# Patient Record
Sex: Female | Born: 1945 | Race: White | Hispanic: No | Marital: Married | Smoking: Never smoker
Health system: Southern US, Community
[De-identification: ages and names within clinical notes are randomized; demographics above are authoritative.]

## PROBLEM LIST (undated history)

## (undated) HISTORY — PX: PARATHYROIDECTOMY / EXPLORATION OF PARATHYROIDS: SUR1002

---

## 1998-05-16 ENCOUNTER — Ambulatory Visit (HOSPITAL_COMMUNITY): Admission: RE | Admit: 1998-05-16 | Discharge: 1998-05-16 | Payer: Self-pay | Admitting: Geriatric Medicine

## 1999-01-23 ENCOUNTER — Encounter: Admission: RE | Admit: 1999-01-23 | Discharge: 1999-01-23 | Payer: Self-pay | Admitting: *Deleted

## 1999-04-19 ENCOUNTER — Other Ambulatory Visit: Admission: RE | Admit: 1999-04-19 | Discharge: 1999-04-19 | Payer: Self-pay | Admitting: *Deleted

## 2000-01-28 ENCOUNTER — Encounter: Admission: RE | Admit: 2000-01-28 | Discharge: 2000-01-28 | Payer: Self-pay | Admitting: *Deleted

## 2000-04-21 ENCOUNTER — Other Ambulatory Visit: Admission: RE | Admit: 2000-04-21 | Discharge: 2000-04-21 | Payer: Self-pay | Admitting: *Deleted

## 2001-02-02 ENCOUNTER — Encounter: Admission: RE | Admit: 2001-02-02 | Discharge: 2001-02-02 | Payer: Self-pay | Admitting: *Deleted

## 2001-05-25 ENCOUNTER — Other Ambulatory Visit: Admission: RE | Admit: 2001-05-25 | Discharge: 2001-05-25 | Payer: Self-pay | Admitting: *Deleted

## 2002-02-03 ENCOUNTER — Encounter: Admission: RE | Admit: 2002-02-03 | Discharge: 2002-02-03 | Payer: Self-pay | Admitting: *Deleted

## 2002-05-10 ENCOUNTER — Other Ambulatory Visit: Admission: RE | Admit: 2002-05-10 | Discharge: 2002-05-10 | Payer: Self-pay | Admitting: *Deleted

## 2003-02-07 ENCOUNTER — Encounter: Admission: RE | Admit: 2003-02-07 | Discharge: 2003-02-07 | Payer: Self-pay | Admitting: *Deleted

## 2003-05-26 ENCOUNTER — Other Ambulatory Visit: Admission: RE | Admit: 2003-05-26 | Discharge: 2003-05-26 | Payer: Self-pay | Admitting: *Deleted

## 2003-08-16 ENCOUNTER — Ambulatory Visit (HOSPITAL_COMMUNITY): Admission: RE | Admit: 2003-08-16 | Discharge: 2003-08-16 | Payer: Self-pay | Admitting: Surgery

## 2003-08-28 ENCOUNTER — Ambulatory Visit (HOSPITAL_COMMUNITY): Admission: RE | Admit: 2003-08-28 | Discharge: 2003-08-28 | Payer: Self-pay | Admitting: Surgery

## 2003-10-12 ENCOUNTER — Observation Stay (HOSPITAL_COMMUNITY): Admission: RE | Admit: 2003-10-12 | Discharge: 2003-10-13 | Payer: Self-pay | Admitting: Surgery

## 2003-10-12 ENCOUNTER — Encounter (INDEPENDENT_AMBULATORY_CARE_PROVIDER_SITE_OTHER): Payer: Self-pay | Admitting: *Deleted

## 2004-02-09 ENCOUNTER — Encounter: Admission: RE | Admit: 2004-02-09 | Discharge: 2004-02-09 | Payer: Self-pay | Admitting: *Deleted

## 2005-02-13 ENCOUNTER — Encounter: Admission: RE | Admit: 2005-02-13 | Discharge: 2005-02-13 | Payer: Self-pay | Admitting: *Deleted

## 2005-06-17 ENCOUNTER — Encounter: Admission: RE | Admit: 2005-06-17 | Discharge: 2005-06-17 | Payer: Self-pay | Admitting: *Deleted

## 2006-02-23 ENCOUNTER — Encounter: Admission: RE | Admit: 2006-02-23 | Discharge: 2006-02-23 | Payer: Self-pay | Admitting: *Deleted

## 2007-03-18 ENCOUNTER — Encounter: Admission: RE | Admit: 2007-03-18 | Discharge: 2007-03-18 | Payer: Self-pay | Admitting: Geriatric Medicine

## 2007-03-24 ENCOUNTER — Encounter: Admission: RE | Admit: 2007-03-24 | Discharge: 2007-03-24 | Payer: Self-pay | Admitting: Geriatric Medicine

## 2008-03-21 ENCOUNTER — Encounter: Admission: RE | Admit: 2008-03-21 | Discharge: 2008-03-21 | Payer: Self-pay | Admitting: Geriatric Medicine

## 2008-11-06 ENCOUNTER — Encounter: Admission: RE | Admit: 2008-11-06 | Discharge: 2008-11-06 | Payer: Self-pay | Admitting: Geriatric Medicine

## 2009-03-23 ENCOUNTER — Encounter: Admission: RE | Admit: 2009-03-23 | Discharge: 2009-03-23 | Payer: Self-pay

## 2010-03-27 ENCOUNTER — Encounter
Admission: RE | Admit: 2010-03-27 | Discharge: 2010-03-27 | Payer: Self-pay | Source: Home / Self Care | Attending: Geriatric Medicine | Admitting: Geriatric Medicine

## 2010-08-12 ENCOUNTER — Other Ambulatory Visit: Payer: Self-pay | Admitting: Obstetrics and Gynecology

## 2010-08-23 NOTE — Op Note (Signed)
NAME:  Eileen Robertson, Eileen Robertson                            ACCOUNT NO.:  [id number]   MEDICAL RECORD NO.:  [id number]                   PATIENT TYPE:  AMB   LOCATION:  DAY                                  FACILITY:  [facility]   PHYSICIAN:  Velora Heckler, M.D.                DATE OF BIRTH:  08-03-1945   DATE OF PROCEDURE:  10/12/2003  DATE OF DISCHARGE:                                 OPERATIVE REPORT   PREOPERATIVE DIAGNOSES:  Primary hyperparathyroidism.   POSTOPERATIVE DIAGNOSES:  Primary hyperparathyroidism.   PROCEDURE:  Parathyroid exploration with right superior parathyroidectomy.   SURGEON:  Velora Heckler, M.D.   ASSISTANT:  Abigail Miyamoto, M.D.   ANESTHESIA:  General.   ESTIMATED BLOOD LOSS:  Minimal.   PREPARATION:  Betadine.   COMPLICATIONS:  None.   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  The patient had  bone density scanning which showed a significant bone loss between  sequential studies. Laboratory studies showed an intact PTH level of 133 and  a serum calcium level of 9.9.  Preoperative level was elevated at 10.3.  A  24 hour urine collection showed elevated level of 260.  The patient had been  seen by Dr. Dorisann Frames, M.D., her endocrinologist, who diagnosed primary  hyperparathyroidism.  The patient subsequently underwent Sestamibi scanning  which failed to identify a parathyroid adenoma. 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  The patient now comes to surgery a neck exploration.   DESCRIPTION OF PROCEDURE:  The procedure was done in OR #1 at the [facility].  The patient is brought to the operating room,  placed in a supine position on the operating room table.  Following the  administration of general anesthesia, the patient is prepped and draped in  the usual strict aseptic  fashion.  After ascertaining that an adequate level  of anesthesia had been obtained, an incision is made in the right mid neck  with a #15 blade.  Dissection was carried down through the skin and  subcutaneous tissues.  The platysma is divided with the electrocautery.  Platysmal flaps are elevated cephalad and caudad and a Weitlaner retractor  is placed for exposure. Strap muscles are incised in the midline. The right  thyroid lobe is identified.  Dissection along the right thyroid lobe reveals  a normal appearing inferior parathyroid gland. This is quite small. It is  just below the inferior pole of the thyroid on the right side of the  trachea. It is convincing for a normal parathyroid gland.  Further  dissection behind the right lobe of the thyroid is carried out. Care is  taken to preserve the recurrent nerve and the inferior thyroid artery.  Carotid artery  is dissected out, hypopharynx and esophagus are carefully  dissected out. The right thyroid lobe is thoroughly explored.  The  retroesophageal space is explored. No obvious evidence of parathyroid  adenoma is noted.   MRI scans are brought to the operating room and again reviewed. There is  clear evidence of  an abnormality in the posterior region behind the thyroid  on the right side. Therefore our incision on the neck is extended across the  midline. Skin flaps are developed cephalad and caudad from the thyroid notch  to the sternal notch and a Mahorner self retaining retractor is placed for  exposure. The strap muscles are fully incised in the midline. The right  thyroid lobe is completely mobilized and rotated medially. Further  exploration along the course of the inferior thyroid artery and recurrent  nerve reveals a mass-like lesion deep in the tracheoesophageal groove. This  is gently dissect out taking care to preserve the recurrent nerve. Branches  of the inferior thyroid artery are divided between small Ligalips.  This   does appear to be parathyroid tissue. The nodule is mobilized. Vascular  tributaries are divided between small Ligaclips and the gland is gently  excised. It measures 9 x 8 x 5 mm in size. It is submitted in toto to  pathology for review. Dr. 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  Good hemostasis is  obtained in the right neck. Surgicel was placed along the course of the  recurrent nerve and in the tracheoesophageal groove. The right thyroid lobe  is returned to its normal position.  Good hemostasis is assured. The strap  muscles are reapproximated in the midline with interrupted 3-0 Vicryl  sutures. The platysma is closed with interrupted 3-0 Vicryl sutures. The  skin edges were reapproximated with a running 4-0 Vicryl subcuticular  suture. The wound is washed and dried and Steri-Strips are applied. Sterile  dressings are applied. The patient is awakened from anesthesia and brought  to the recovery room in stable condition. The patient tolerated the  procedure well.                                               Velora Heckler, M.D.    TMG/MEDQ  D:  10/12/2003  T:  10/12/2003  Job:  [id number]   cc:   Hal T. Stoneking, M.D.  [address]  [place], [place] [address]  Fax: [phone]   Dorisann Frames, M.D.  [address] [place] [address]  Fax: [phone]   Pershing Cox, M.D.  [address]  [place]  [place] [address]  Fax: [phone]

## 2011-02-24 ENCOUNTER — Other Ambulatory Visit: Payer: Self-pay | Admitting: Geriatric Medicine

## 2011-02-24 DIAGNOSIS — Z1231 Encounter for screening mammogram for malignant neoplasm of breast: Secondary | ICD-10-CM

## 2011-04-04 ENCOUNTER — Ambulatory Visit
Admission: RE | Admit: 2011-04-04 | Discharge: 2011-04-04 | Disposition: A | Discharge status: Home / Self Care | Payer: BC Managed Care – PPO | Source: Ambulatory Visit | Attending: Geriatric Medicine | Admitting: Geriatric Medicine

## 2011-04-04 DIAGNOSIS — Z1231 Encounter for screening mammogram for malignant neoplasm of breast: Secondary | ICD-10-CM

## 2011-04-11 ENCOUNTER — Other Ambulatory Visit: Payer: Self-pay | Admitting: Geriatric Medicine

## 2011-04-11 DIAGNOSIS — R928 Other abnormal and inconclusive findings on diagnostic imaging of breast: Secondary | ICD-10-CM

## 2011-04-18 ENCOUNTER — Ambulatory Visit
Admission: RE | Admit: 2011-04-18 | Discharge: 2011-04-18 | Disposition: A | Discharge status: Home / Self Care | Payer: BC Managed Care – PPO | Source: Ambulatory Visit | Attending: Geriatric Medicine | Admitting: Geriatric Medicine

## 2011-04-18 DIAGNOSIS — R928 Other abnormal and inconclusive findings on diagnostic imaging of breast: Secondary | ICD-10-CM

## 2011-09-16 ENCOUNTER — Other Ambulatory Visit: Payer: Self-pay | Admitting: Dermatology

## 2011-10-20 ENCOUNTER — Other Ambulatory Visit: Payer: Self-pay | Admitting: Dermatology

## 2011-10-20 DIAGNOSIS — D485 Neoplasm of uncertain behavior of skin: Secondary | ICD-10-CM | POA: Diagnosis not present

## 2011-11-06 DIAGNOSIS — C44621 Squamous cell carcinoma of skin of unspecified upper limb, including shoulder: Secondary | ICD-10-CM | POA: Diagnosis not present

## 2012-01-08 DIAGNOSIS — Z85828 Personal history of other malignant neoplasm of skin: Secondary | ICD-10-CM | POA: Diagnosis not present

## 2012-01-08 DIAGNOSIS — L570 Actinic keratosis: Secondary | ICD-10-CM | POA: Diagnosis not present

## 2012-02-11 ENCOUNTER — Other Ambulatory Visit: Payer: Self-pay | Admitting: Geriatric Medicine

## 2012-02-11 DIAGNOSIS — Z1231 Encounter for screening mammogram for malignant neoplasm of breast: Secondary | ICD-10-CM

## 2012-02-24 DIAGNOSIS — Z23 Encounter for immunization: Secondary | ICD-10-CM | POA: Diagnosis not present

## 2012-04-05 ENCOUNTER — Ambulatory Visit
Admission: RE | Admit: 2012-04-05 | Discharge: 2012-04-05 | Disposition: A | Discharge status: Home / Self Care | Payer: Medicare Other | Source: Ambulatory Visit | Attending: Geriatric Medicine | Admitting: Geriatric Medicine

## 2012-04-05 DIAGNOSIS — Z1231 Encounter for screening mammogram for malignant neoplasm of breast: Secondary | ICD-10-CM | POA: Diagnosis not present

## 2012-05-28 DIAGNOSIS — D1801 Hemangioma of skin and subcutaneous tissue: Secondary | ICD-10-CM | POA: Diagnosis not present

## 2012-05-28 DIAGNOSIS — L570 Actinic keratosis: Secondary | ICD-10-CM | POA: Diagnosis not present

## 2012-05-28 DIAGNOSIS — D239 Other benign neoplasm of skin, unspecified: Secondary | ICD-10-CM | POA: Diagnosis not present

## 2012-06-28 DIAGNOSIS — Z79899 Other long term (current) drug therapy: Secondary | ICD-10-CM | POA: Diagnosis not present

## 2012-06-28 DIAGNOSIS — Z23 Encounter for immunization: Secondary | ICD-10-CM | POA: Diagnosis not present

## 2012-06-28 DIAGNOSIS — M899 Disorder of bone, unspecified: Secondary | ICD-10-CM | POA: Diagnosis not present

## 2012-06-28 DIAGNOSIS — Z0000 Encounter for general adult medical examination without abnormal findings: Secondary | ICD-10-CM | POA: Diagnosis not present

## 2012-06-28 DIAGNOSIS — F329 Major depressive disorder, single episode, unspecified: Secondary | ICD-10-CM | POA: Diagnosis not present

## 2012-08-10 DIAGNOSIS — M899 Disorder of bone, unspecified: Secondary | ICD-10-CM | POA: Diagnosis not present

## 2012-08-10 DIAGNOSIS — M949 Disorder of cartilage, unspecified: Secondary | ICD-10-CM | POA: Diagnosis not present

## 2012-09-09 DIAGNOSIS — H52229 Regular astigmatism, unspecified eye: Secondary | ICD-10-CM | POA: Diagnosis not present

## 2012-09-09 DIAGNOSIS — H524 Presbyopia: Secondary | ICD-10-CM | POA: Diagnosis not present

## 2013-03-10 ENCOUNTER — Other Ambulatory Visit: Payer: Self-pay

## 2013-03-10 DIAGNOSIS — Z1231 Encounter for screening mammogram for malignant neoplasm of breast: Secondary | ICD-10-CM

## 2013-04-15 ENCOUNTER — Ambulatory Visit
Admission: RE | Admit: 2013-04-15 | Discharge: 2013-04-15 | Disposition: A | Discharge status: Home / Self Care | Payer: Medicare Other | Source: Ambulatory Visit

## 2013-04-15 DIAGNOSIS — Z1231 Encounter for screening mammogram for malignant neoplasm of breast: Secondary | ICD-10-CM

## 2013-04-18 DIAGNOSIS — Z124 Encounter for screening for malignant neoplasm of cervix: Secondary | ICD-10-CM | POA: Diagnosis not present

## 2013-04-18 DIAGNOSIS — Z01419 Encounter for gynecological examination (general) (routine) without abnormal findings: Secondary | ICD-10-CM | POA: Diagnosis not present

## 2013-06-09 DIAGNOSIS — D239 Other benign neoplasm of skin, unspecified: Secondary | ICD-10-CM | POA: Diagnosis not present

## 2013-06-09 DIAGNOSIS — Z85828 Personal history of other malignant neoplasm of skin: Secondary | ICD-10-CM | POA: Diagnosis not present

## 2013-06-09 DIAGNOSIS — D1801 Hemangioma of skin and subcutaneous tissue: Secondary | ICD-10-CM | POA: Diagnosis not present

## 2013-06-09 DIAGNOSIS — L819 Disorder of pigmentation, unspecified: Secondary | ICD-10-CM | POA: Diagnosis not present

## 2013-06-09 DIAGNOSIS — L570 Actinic keratosis: Secondary | ICD-10-CM | POA: Diagnosis not present

## 2013-06-09 DIAGNOSIS — L821 Other seborrheic keratosis: Secondary | ICD-10-CM | POA: Diagnosis not present

## 2013-06-30 DIAGNOSIS — Z79899 Other long term (current) drug therapy: Secondary | ICD-10-CM | POA: Diagnosis not present

## 2013-06-30 DIAGNOSIS — Z0000 Encounter for general adult medical examination without abnormal findings: Secondary | ICD-10-CM | POA: Diagnosis not present

## 2013-06-30 DIAGNOSIS — Z1331 Encounter for screening for depression: Secondary | ICD-10-CM | POA: Diagnosis not present

## 2013-06-30 DIAGNOSIS — Z23 Encounter for immunization: Secondary | ICD-10-CM | POA: Diagnosis not present

## 2013-07-01 DIAGNOSIS — Z79899 Other long term (current) drug therapy: Secondary | ICD-10-CM | POA: Diagnosis not present

## 2013-07-01 DIAGNOSIS — Z0000 Encounter for general adult medical examination without abnormal findings: Secondary | ICD-10-CM | POA: Diagnosis not present

## 2013-07-01 DIAGNOSIS — Z136 Encounter for screening for cardiovascular disorders: Secondary | ICD-10-CM | POA: Diagnosis not present

## 2013-07-01 DIAGNOSIS — F329 Major depressive disorder, single episode, unspecified: Secondary | ICD-10-CM | POA: Diagnosis not present

## 2013-07-01 DIAGNOSIS — F3289 Other specified depressive episodes: Secondary | ICD-10-CM | POA: Diagnosis not present

## 2013-10-25 DIAGNOSIS — H2510 Age-related nuclear cataract, unspecified eye: Secondary | ICD-10-CM | POA: Diagnosis not present

## 2013-12-05 DIAGNOSIS — H00019 Hordeolum externum unspecified eye, unspecified eyelid: Secondary | ICD-10-CM | POA: Diagnosis not present

## 2014-01-17 DIAGNOSIS — Z23 Encounter for immunization: Secondary | ICD-10-CM | POA: Diagnosis not present

## 2014-03-23 ENCOUNTER — Other Ambulatory Visit: Payer: Self-pay

## 2014-03-23 DIAGNOSIS — Z1231 Encounter for screening mammogram for malignant neoplasm of breast: Secondary | ICD-10-CM

## 2014-04-19 ENCOUNTER — Ambulatory Visit
Admission: RE | Admit: 2014-04-19 | Discharge: 2014-04-19 | Disposition: A | Discharge status: Home / Self Care | Payer: Medicare Other | Source: Ambulatory Visit

## 2014-04-19 DIAGNOSIS — Z1231 Encounter for screening mammogram for malignant neoplasm of breast: Secondary | ICD-10-CM | POA: Diagnosis not present

## 2014-05-17 DIAGNOSIS — Z124 Encounter for screening for malignant neoplasm of cervix: Secondary | ICD-10-CM | POA: Diagnosis not present

## 2014-05-17 DIAGNOSIS — Z01419 Encounter for gynecological examination (general) (routine) without abnormal findings: Secondary | ICD-10-CM | POA: Diagnosis not present

## 2014-06-15 DIAGNOSIS — S93401A Sprain of unspecified ligament of right ankle, initial encounter: Secondary | ICD-10-CM | POA: Diagnosis not present

## 2014-07-05 DIAGNOSIS — R5383 Other fatigue: Secondary | ICD-10-CM | POA: Diagnosis not present

## 2014-07-05 DIAGNOSIS — M8580 Other specified disorders of bone density and structure, unspecified site: Secondary | ICD-10-CM | POA: Diagnosis not present

## 2014-07-05 DIAGNOSIS — G4700 Insomnia, unspecified: Secondary | ICD-10-CM | POA: Diagnosis not present

## 2014-07-05 DIAGNOSIS — Z79899 Other long term (current) drug therapy: Secondary | ICD-10-CM | POA: Diagnosis not present

## 2014-07-05 DIAGNOSIS — Z0000 Encounter for general adult medical examination without abnormal findings: Secondary | ICD-10-CM | POA: Diagnosis not present

## 2014-07-05 DIAGNOSIS — F325 Major depressive disorder, single episode, in full remission: Secondary | ICD-10-CM | POA: Diagnosis not present

## 2014-07-05 DIAGNOSIS — Z1389 Encounter for screening for other disorder: Secondary | ICD-10-CM | POA: Diagnosis not present

## 2014-07-05 DIAGNOSIS — E780 Pure hypercholesterolemia: Secondary | ICD-10-CM | POA: Diagnosis not present

## 2014-07-20 DIAGNOSIS — H6121 Impacted cerumen, right ear: Secondary | ICD-10-CM | POA: Diagnosis not present

## 2014-07-20 DIAGNOSIS — J309 Allergic rhinitis, unspecified: Secondary | ICD-10-CM | POA: Diagnosis not present

## 2014-08-09 DIAGNOSIS — Z85828 Personal history of other malignant neoplasm of skin: Secondary | ICD-10-CM | POA: Diagnosis not present

## 2014-08-09 DIAGNOSIS — L812 Freckles: Secondary | ICD-10-CM | POA: Diagnosis not present

## 2014-08-09 DIAGNOSIS — D1801 Hemangioma of skin and subcutaneous tissue: Secondary | ICD-10-CM | POA: Diagnosis not present

## 2014-08-09 DIAGNOSIS — L820 Inflamed seborrheic keratosis: Secondary | ICD-10-CM | POA: Diagnosis not present

## 2014-08-09 DIAGNOSIS — L821 Other seborrheic keratosis: Secondary | ICD-10-CM | POA: Diagnosis not present

## 2014-08-09 DIAGNOSIS — D225 Melanocytic nevi of trunk: Secondary | ICD-10-CM | POA: Diagnosis not present

## 2014-08-09 DIAGNOSIS — L570 Actinic keratosis: Secondary | ICD-10-CM | POA: Diagnosis not present

## 2014-10-31 DIAGNOSIS — H2513 Age-related nuclear cataract, bilateral: Secondary | ICD-10-CM | POA: Diagnosis not present

## 2014-10-31 DIAGNOSIS — H5211 Myopia, right eye: Secondary | ICD-10-CM | POA: Diagnosis not present

## 2014-10-31 DIAGNOSIS — H524 Presbyopia: Secondary | ICD-10-CM | POA: Diagnosis not present

## 2014-10-31 DIAGNOSIS — H52223 Regular astigmatism, bilateral: Secondary | ICD-10-CM | POA: Diagnosis not present

## 2014-12-30 DIAGNOSIS — S93401A Sprain of unspecified ligament of right ankle, initial encounter: Secondary | ICD-10-CM | POA: Diagnosis not present

## 2014-12-30 DIAGNOSIS — S93601A Unspecified sprain of right foot, initial encounter: Secondary | ICD-10-CM | POA: Diagnosis not present

## 2015-01-03 DIAGNOSIS — S93401A Sprain of unspecified ligament of right ankle, initial encounter: Secondary | ICD-10-CM | POA: Diagnosis not present

## 2015-01-03 DIAGNOSIS — F325 Major depressive disorder, single episode, in full remission: Secondary | ICD-10-CM | POA: Diagnosis not present

## 2015-01-03 DIAGNOSIS — Z23 Encounter for immunization: Secondary | ICD-10-CM | POA: Diagnosis not present

## 2015-03-15 ENCOUNTER — Other Ambulatory Visit: Payer: Self-pay

## 2015-03-15 DIAGNOSIS — Z1231 Encounter for screening mammogram for malignant neoplasm of breast: Secondary | ICD-10-CM

## 2015-04-24 ENCOUNTER — Ambulatory Visit
Admission: RE | Admit: 2015-04-24 | Discharge: 2015-04-24 | Disposition: A | Discharge status: Home / Self Care | Payer: Medicare Other | Source: Ambulatory Visit

## 2015-04-24 DIAGNOSIS — Z1231 Encounter for screening mammogram for malignant neoplasm of breast: Secondary | ICD-10-CM | POA: Diagnosis not present

## 2015-07-09 DIAGNOSIS — Z0000 Encounter for general adult medical examination without abnormal findings: Secondary | ICD-10-CM | POA: Diagnosis not present

## 2015-07-09 DIAGNOSIS — Z79899 Other long term (current) drug therapy: Secondary | ICD-10-CM | POA: Diagnosis not present

## 2015-07-09 DIAGNOSIS — J301 Allergic rhinitis due to pollen: Secondary | ICD-10-CM | POA: Diagnosis not present

## 2015-07-09 DIAGNOSIS — M8580 Other specified disorders of bone density and structure, unspecified site: Secondary | ICD-10-CM | POA: Diagnosis not present

## 2015-07-09 DIAGNOSIS — F325 Major depressive disorder, single episode, in full remission: Secondary | ICD-10-CM | POA: Diagnosis not present

## 2015-07-10 DIAGNOSIS — M859 Disorder of bone density and structure, unspecified: Secondary | ICD-10-CM | POA: Diagnosis not present

## 2015-07-10 DIAGNOSIS — M8589 Other specified disorders of bone density and structure, multiple sites: Secondary | ICD-10-CM | POA: Diagnosis not present

## 2015-10-12 DIAGNOSIS — D485 Neoplasm of uncertain behavior of skin: Secondary | ICD-10-CM | POA: Diagnosis not present

## 2015-10-12 DIAGNOSIS — L821 Other seborrheic keratosis: Secondary | ICD-10-CM | POA: Diagnosis not present

## 2015-10-12 DIAGNOSIS — L570 Actinic keratosis: Secondary | ICD-10-CM | POA: Diagnosis not present

## 2015-10-12 DIAGNOSIS — L820 Inflamed seborrheic keratosis: Secondary | ICD-10-CM | POA: Diagnosis not present

## 2015-10-12 DIAGNOSIS — D2262 Melanocytic nevi of left upper limb, including shoulder: Secondary | ICD-10-CM | POA: Diagnosis not present

## 2015-10-12 DIAGNOSIS — D225 Melanocytic nevi of trunk: Secondary | ICD-10-CM | POA: Diagnosis not present

## 2015-10-12 DIAGNOSIS — Z85828 Personal history of other malignant neoplasm of skin: Secondary | ICD-10-CM | POA: Diagnosis not present

## 2015-10-12 DIAGNOSIS — L812 Freckles: Secondary | ICD-10-CM | POA: Diagnosis not present

## 2015-10-12 DIAGNOSIS — D2261 Melanocytic nevi of right upper limb, including shoulder: Secondary | ICD-10-CM | POA: Diagnosis not present

## 2015-10-12 DIAGNOSIS — C44729 Squamous cell carcinoma of skin of left lower limb, including hip: Secondary | ICD-10-CM | POA: Diagnosis not present

## 2015-11-20 DIAGNOSIS — H25011 Cortical age-related cataract, right eye: Secondary | ICD-10-CM | POA: Diagnosis not present

## 2015-11-20 DIAGNOSIS — H2512 Age-related nuclear cataract, left eye: Secondary | ICD-10-CM | POA: Diagnosis not present

## 2015-11-20 DIAGNOSIS — H18411 Arcus senilis, right eye: Secondary | ICD-10-CM | POA: Diagnosis not present

## 2015-11-20 DIAGNOSIS — H2511 Age-related nuclear cataract, right eye: Secondary | ICD-10-CM | POA: Diagnosis not present

## 2015-12-12 DIAGNOSIS — C44622 Squamous cell carcinoma of skin of right upper limb, including shoulder: Secondary | ICD-10-CM | POA: Diagnosis not present

## 2015-12-12 DIAGNOSIS — C44729 Squamous cell carcinoma of skin of left lower limb, including hip: Secondary | ICD-10-CM | POA: Diagnosis not present

## 2015-12-12 DIAGNOSIS — D485 Neoplasm of uncertain behavior of skin: Secondary | ICD-10-CM | POA: Diagnosis not present

## 2015-12-12 DIAGNOSIS — Z85828 Personal history of other malignant neoplasm of skin: Secondary | ICD-10-CM | POA: Diagnosis not present

## 2015-12-19 DIAGNOSIS — Z124 Encounter for screening for malignant neoplasm of cervix: Secondary | ICD-10-CM | POA: Diagnosis not present

## 2015-12-19 DIAGNOSIS — Z01419 Encounter for gynecological examination (general) (routine) without abnormal findings: Secondary | ICD-10-CM | POA: Diagnosis not present

## 2015-12-24 DIAGNOSIS — M545 Low back pain: Secondary | ICD-10-CM | POA: Diagnosis not present

## 2015-12-26 DIAGNOSIS — Z85828 Personal history of other malignant neoplasm of skin: Secondary | ICD-10-CM | POA: Diagnosis not present

## 2015-12-26 DIAGNOSIS — C44729 Squamous cell carcinoma of skin of left lower limb, including hip: Secondary | ICD-10-CM | POA: Diagnosis not present

## 2015-12-28 DIAGNOSIS — H2511 Age-related nuclear cataract, right eye: Secondary | ICD-10-CM | POA: Diagnosis not present

## 2015-12-28 DIAGNOSIS — H2512 Age-related nuclear cataract, left eye: Secondary | ICD-10-CM | POA: Diagnosis not present

## 2016-01-04 DIAGNOSIS — H2511 Age-related nuclear cataract, right eye: Secondary | ICD-10-CM | POA: Diagnosis not present

## 2016-01-14 DIAGNOSIS — H2512 Age-related nuclear cataract, left eye: Secondary | ICD-10-CM | POA: Diagnosis not present

## 2016-02-13 DIAGNOSIS — L820 Inflamed seborrheic keratosis: Secondary | ICD-10-CM | POA: Diagnosis not present

## 2016-02-13 DIAGNOSIS — D485 Neoplasm of uncertain behavior of skin: Secondary | ICD-10-CM | POA: Diagnosis not present

## 2016-02-13 DIAGNOSIS — L570 Actinic keratosis: Secondary | ICD-10-CM | POA: Diagnosis not present

## 2016-02-13 DIAGNOSIS — Z85828 Personal history of other malignant neoplasm of skin: Secondary | ICD-10-CM | POA: Diagnosis not present

## 2016-04-16 ENCOUNTER — Other Ambulatory Visit: Payer: Self-pay | Admitting: Geriatric Medicine

## 2016-04-16 DIAGNOSIS — Z1231 Encounter for screening mammogram for malignant neoplasm of breast: Secondary | ICD-10-CM

## 2016-05-23 ENCOUNTER — Ambulatory Visit
Admission: RE | Admit: 2016-05-23 | Discharge: 2016-05-23 | Disposition: A | Discharge status: Home / Self Care | Payer: PPO | Source: Ambulatory Visit | Attending: Geriatric Medicine | Admitting: Geriatric Medicine

## 2016-05-23 DIAGNOSIS — Z1231 Encounter for screening mammogram for malignant neoplasm of breast: Secondary | ICD-10-CM | POA: Diagnosis not present

## 2016-07-09 DIAGNOSIS — Z0000 Encounter for general adult medical examination without abnormal findings: Secondary | ICD-10-CM | POA: Diagnosis not present

## 2016-07-09 DIAGNOSIS — M545 Low back pain: Secondary | ICD-10-CM | POA: Diagnosis not present

## 2016-07-09 DIAGNOSIS — Z79899 Other long term (current) drug therapy: Secondary | ICD-10-CM | POA: Diagnosis not present

## 2016-07-09 DIAGNOSIS — M8580 Other specified disorders of bone density and structure, unspecified site: Secondary | ICD-10-CM | POA: Diagnosis not present

## 2016-07-09 DIAGNOSIS — F325 Major depressive disorder, single episode, in full remission: Secondary | ICD-10-CM | POA: Diagnosis not present

## 2016-07-21 ENCOUNTER — Emergency Department (HOSPITAL_COMMUNITY)
Admission: EM | Admit: 2016-07-21 | Discharge: 2016-07-21 | Disposition: A | Discharge status: Home / Self Care | Payer: PPO | Attending: Emergency Medicine | Admitting: Emergency Medicine

## 2016-07-21 ENCOUNTER — Emergency Department (HOSPITAL_COMMUNITY): Payer: PPO

## 2016-07-21 ENCOUNTER — Encounter (HOSPITAL_COMMUNITY): Payer: Self-pay | Admitting: Emergency Medicine

## 2016-07-21 DIAGNOSIS — R110 Nausea: Secondary | ICD-10-CM | POA: Diagnosis not present

## 2016-07-21 DIAGNOSIS — R42 Dizziness and giddiness: Secondary | ICD-10-CM | POA: Insufficient documentation

## 2016-07-21 DIAGNOSIS — I493 Ventricular premature depolarization: Secondary | ICD-10-CM | POA: Diagnosis not present

## 2016-07-21 DIAGNOSIS — R404 Transient alteration of awareness: Secondary | ICD-10-CM | POA: Diagnosis not present

## 2016-07-21 LAB — I-STAT CHEM 8, ED
BUN: 18 mg/dL (ref 6–20)
CREATININE: 0.8 mg/dL (ref 0.44–1.00)
Calcium, Ion: 1.21 mmol/L (ref 1.15–1.40)
Chloride: 104 mmol/L (ref 101–111)
Glucose, Bld: 110 mg/dL — ABNORMAL HIGH (ref 65–99)
HEMATOCRIT: 44 % (ref 36.0–46.0)
Hemoglobin: 15 g/dL (ref 12.0–15.0)
POTASSIUM: 4.4 mmol/L (ref 3.5–5.1)
Sodium: 141 mmol/L (ref 135–145)
TCO2: 31 mmol/L (ref 0–100)

## 2016-07-21 LAB — COMPREHENSIVE METABOLIC PANEL
ALBUMIN: 4.4 g/dL (ref 3.5–5.0)
ALT: 15 U/L (ref 14–54)
AST: 23 U/L (ref 15–41)
Alkaline Phosphatase: 49 U/L (ref 38–126)
Anion gap: 9 (ref 5–15)
BUN: 13 mg/dL (ref 6–20)
CHLORIDE: 105 mmol/L (ref 101–111)
CO2: 27 mmol/L (ref 22–32)
CREATININE: 0.84 mg/dL (ref 0.44–1.00)
Calcium: 10.3 mg/dL (ref 8.9–10.3)
GFR calc Af Amer: >60 mL/min (ref >60)
GFR calc non Af Amer: >60 mL/min (ref >60)
Glucose, Bld: 112 mg/dL — ABNORMAL HIGH (ref 65–99)
Potassium: 4 mmol/L (ref 3.5–5.1)
SODIUM: 141 mmol/L (ref 135–145)
Total Bilirubin: 0.8 mg/dL (ref 0.3–1.2)
Total Protein: 6.7 g/dL (ref 6.5–8.1)

## 2016-07-21 LAB — CBC
HCT: 42.3 % (ref 36.0–46.0)
Hemoglobin: 14.9 g/dL (ref 12.0–15.0)
MCH: 32.7 pg (ref 26.0–34.0)
MCHC: 35.2 g/dL (ref 30.0–36.0)
MCV: 92.8 fL (ref 78.0–100.0)
PLATELETS: 179 10*3/uL (ref 150–400)
RBC: 4.56 MIL/uL (ref 3.87–5.11)
RDW: 12.7 % (ref 11.5–15.5)
WBC: 8.6 10*3/uL (ref 4.0–10.5)

## 2016-07-21 LAB — MAGNESIUM: Magnesium: 1.9 mg/dL (ref 1.7–2.4)

## 2016-07-21 LAB — I-STAT TROPONIN, ED
Troponin i, poc: 0 ng/mL (ref 0.00–0.08)
Troponin i, poc: 0 ng/mL (ref 0.00–0.08)

## 2016-07-21 LAB — URINALYSIS, ROUTINE W REFLEX MICROSCOPIC
Bilirubin Urine: NEGATIVE
GLUCOSE, UA: NEGATIVE mg/dL
Hgb urine dipstick: NEGATIVE
Ketones, ur: 5 mg/dL — AB
LEUKOCYTES UA: NEGATIVE
NITRITE: NEGATIVE
PH: 7 (ref 5.0–8.0)
PROTEIN: NEGATIVE mg/dL
Specific Gravity, Urine: 1.003 — ABNORMAL LOW (ref 1.005–1.030)

## 2016-07-21 NOTE — ED Provider Notes (Signed)
[facility] DEPT Provider Note   CSN: [id number] Arrival date & time: 07/21/16  1239     History   Chief Complaint Chief Complaint  Patient presents with  . Dizziness    HPI GAE BIHL is a 71 y.o. female.  71 yo F with a chief complaints of lightheadedness and nausea. Occurred while she was taking trash out today. Improved a little with rest and recurred when she got back up to move around. Denies history of hypertension hyperlipidemia diabetes. Denies family history. Denies history of PE or DVT. Denies history of cancer. Does not take any medications at home.   The history is provided by the patient.  Dizziness  Associated symptoms: nausea   Associated symptoms: no chest pain, no headaches, no palpitations, no shortness of breath and no vomiting   Illness  This is a new problem. The current episode started yesterday. The problem occurs constantly. The problem has not changed since onset.Pertinent negatives include no chest pain, no headaches and no shortness of breath. Nothing aggravates the symptoms. Nothing relieves the symptoms. She has tried nothing for the symptoms. The treatment provided no relief.    History reviewed. No pertinent past medical history.  There are no active problems to display for this patient.   Past Surgical History:  Procedure Laterality Date  . CESAREAN SECTION    . PARATHYROIDECTOMY / EXPLORATION OF PARATHYROIDS      OB History    No data available       Home Medications    Prior to Admission medications   Not on File    Family History Family History  Problem Relation Age of Onset  . Breast cancer Mother 10    Social History Social History  Substance Use Topics  . Smoking status: Never Smoker  . Smokeless tobacco: Never Used  . Alcohol use 0.6 oz/week    1 Glasses of wine per week     Allergies   Patient has no known allergies.   Review of Systems Review of Systems  Constitutional: Negative for chills and  fever.  HENT: Negative for congestion and rhinorrhea.   Eyes: Negative for redness and visual disturbance.  Respiratory: Negative for shortness of breath and wheezing.   Cardiovascular: Negative for chest pain and palpitations.  Gastrointestinal: Positive for nausea. Negative for vomiting.  Genitourinary: Negative for dysuria and urgency.  Musculoskeletal: Negative for arthralgias and myalgias.  Skin: Negative for pallor and wound.  Neurological: Negative for dizziness and headaches.     Physical Exam Updated Vital Signs BP (!) 123/94   Pulse (!) 40   Temp 98.2 F (36.8 C) (Oral)   Resp 20   SpO2 99%   Physical Exam  Constitutional: She is oriented to person, place, and time. She appears well-developed and well-nourished. No distress.  HENT:  Head: Normocephalic and atraumatic.  Eyes: EOM are normal. Pupils are equal, round, and reactive to light.  Neck: Normal range of motion. Neck supple.  Cardiovascular: Normal rate and regular rhythm.  Exam reveals no gallop and no friction rub.   No murmur heard. Pulmonary/Chest: Effort normal. She has no wheezes. She has no rales.  Abdominal: Soft. She exhibits no distension and no mass. There is no tenderness. There is no guarding.  Musculoskeletal: She exhibits no edema or tenderness.  Neurological: She is alert and oriented to person, place, and time.  Skin: Skin is warm and dry. She is not diaphoretic.  Psychiatric: She has a normal mood and affect. Her  behavior is normal.  Nursing note and vitals reviewed.    ED Treatments / Results  Labs (all labs ordered are listed, but only abnormal results are displayed) Labs Reviewed  COMPREHENSIVE METABOLIC PANEL - Abnormal; Notable for the following:       Result Value   Glucose, Bld 112 (*)    All other components within normal limits  URINALYSIS, ROUTINE W REFLEX MICROSCOPIC - Abnormal; Notable for the following:    Color, Urine STRAW (*)    Specific Gravity, Urine 1.003 (*)     Ketones, ur 5 (*)    All other components within normal limits  I-STAT CHEM 8, ED - Abnormal; Notable for the following:    Glucose, Bld 110 (*)    All other components within normal limits  CBC  MAGNESIUM  I-STAT TROPOININ, ED  I-STAT TROPOININ, ED    EKG  EKG Interpretation  Date/Time:  Monday July 21 2016 12:48:59 EDT Ventricular Rate:  73 PR Interval:    QRS Duration: 82 QT Interval:  420 QTC Calculation: 463 R Axis:   53 Text Interpretation:  Sinus rhythm Ventricular trigeminy Biatrial enlargement Otherwise no significant change Confirmed by Tyrone Nine MD, Quillian Quince ([id number]) on 07/21/2016 1:47:55 PM Also confirmed by Tyrone Nine MD, DANIEL [phone]), editor Drema Pry [phone])  on 07/21/2016 2:00:41 PM       Radiology Dg Chest 2 View  Result Date: 07/21/2016 CLINICAL DATA:  Vertigo. EXAM: CHEST  2 VIEW COMPARISON:  Radiographs of October 11, 2003. FINDINGS: The heart size and mediastinal contours are within normal limits. Both lungs are clear. No pneumothorax or pleural effusion is noted. Atherosclerosis of thoracic aorta is noted. The visualized skeletal structures are unremarkable. IMPRESSION: No active cardiopulmonary disease.  Aortic atherosclerosis. Electronically Signed   By: Marijo Conception, M.D.   On: 07/21/2016 14:09    Procedures Procedures (including critical care time)  Medications Ordered in ED Medications - No data to display   Initial Impression / Assessment and Plan / ED Course  I have reviewed the triage vital signs and the nursing notes.  Pertinent labs & imaging results that were available during my care of the patient were reviewed by me and considered in my medical decision making (see chart for details).     71 yo F With a chief complaint of lightheadedness and nausea. She was taking trash out. Improved a little with rest. Denies any other specific symptoms. Benign exam. Will obtain a delta troponin to rule out and STEMI. EKG with significant PVCs. One of  the cause of her symptoms. If the delta trop is negative we'll have her follow-up with her PCP in the next couple days for possible stress testing or further evaluation.   Turned over to Dr. Rex Kras, please see their note for further care.   The patients results and plan were reviewed and discussed.   Any x-rays performed were independently reviewed by myself.   Differential diagnosis were considered with the presenting HPI.  Medications - No data to display  Vitals:   07/21/16 1615 07/21/16 1730 07/21/16 1745 07/21/16 1830  BP: 126/73 131/73 (!) 157/91 (!) 123/94  Pulse: (!) 40     Resp: 14 17 (!) 26 20  Temp:    98.2 F (36.8 C)  TempSrc:    Oral  SpO2: 99%       Final diagnoses:  Nausea  Lightheadedness  PVC's (premature ventricular contractions)     Final Clinical Impressions(s) / ED Diagnoses   Final  diagnoses:  Nausea  Lightheadedness  PVC's (premature ventricular contractions)    New Prescriptions There are no discharge medications for this patient.    Deno Etienne, DO 07/21/16 [phone]

## 2016-07-21 NOTE — ED Provider Notes (Signed)
Received patient in signout from Dr. Tyrone Nine. We were awaiting a second troponin after the patient had presented with an episode of lightheadedness and nausea that resolved with no recurrence in the ED. Second troponin was unremarkable. On reexamination, the patient was sitting up in bed with reassuring vital signs, sinus rhythm on the monitor, comfortable with no complaints. I reviewed her EKG with Dr. Tyrone Nine which showed sinus rhythm with PVCs. Family was concerned because family member who had previously had cardiac issues and requested cardiology consult. I did discuss the patient's EKG with cardiologist on call, Dr. Stanford Breed. I appreciate his assistance. He reviewed her EKG and agreed that she has no concerning findings. I discussed follow-up with PCP this week for reevaluation and consideration of further testing such as Holter monitor or stress test at patient's symptoms recur. Reviewed her precautions including any sudden worsening of symptoms, chest pain, shortness of breath, or other new alarming symptoms. Patient voiced understanding and was discharged in satisfactory condition.   Sharlett Iles, MD 07/21/16 [phone]

## 2016-07-21 NOTE — ED Notes (Signed)
PT is alert and oriented and SR 85 on monitor.  PT states today she was getting trash out and then had a sudden onset of nausea and dizziness.  Pt got zofran and felt better. Pt states she is feeling a little better.  Dr. Tyrone Nine at the bedside.

## 2016-07-21 NOTE — ED Notes (Signed)
Pt and family are asking for a cardiologist to review her EKG. Dr. Rex Kras informed of patient's request.

## 2016-07-21 NOTE — ED Triage Notes (Signed)
Pt here from home with c/o lightheaded and nausea this morning around 1030 , pt received 4mg  zofran

## 2016-07-22 DIAGNOSIS — I498 Other specified cardiac arrhythmias: Secondary | ICD-10-CM | POA: Diagnosis not present

## 2016-07-22 DIAGNOSIS — E211 Secondary hyperparathyroidism, not elsewhere classified: Secondary | ICD-10-CM | POA: Diagnosis not present

## 2016-07-22 DIAGNOSIS — R42 Dizziness and giddiness: Secondary | ICD-10-CM | POA: Diagnosis not present

## 2016-07-22 DIAGNOSIS — I493 Ventricular premature depolarization: Secondary | ICD-10-CM | POA: Diagnosis not present

## 2016-07-31 ENCOUNTER — Ambulatory Visit (INDEPENDENT_AMBULATORY_CARE_PROVIDER_SITE_OTHER): Payer: PPO

## 2016-07-31 DIAGNOSIS — I493 Ventricular premature depolarization: Secondary | ICD-10-CM | POA: Diagnosis not present

## 2016-10-22 DIAGNOSIS — D485 Neoplasm of uncertain behavior of skin: Secondary | ICD-10-CM | POA: Diagnosis not present

## 2016-10-22 DIAGNOSIS — Z85828 Personal history of other malignant neoplasm of skin: Secondary | ICD-10-CM | POA: Diagnosis not present

## 2016-10-22 DIAGNOSIS — L570 Actinic keratosis: Secondary | ICD-10-CM | POA: Diagnosis not present

## 2016-12-03 DIAGNOSIS — J019 Acute sinusitis, unspecified: Secondary | ICD-10-CM | POA: Diagnosis not present

## 2016-12-24 DIAGNOSIS — Z23 Encounter for immunization: Secondary | ICD-10-CM | POA: Diagnosis not present

## 2016-12-26 DIAGNOSIS — L814 Other melanin hyperpigmentation: Secondary | ICD-10-CM | POA: Diagnosis not present

## 2016-12-26 DIAGNOSIS — L570 Actinic keratosis: Secondary | ICD-10-CM | POA: Diagnosis not present

## 2016-12-26 DIAGNOSIS — D1801 Hemangioma of skin and subcutaneous tissue: Secondary | ICD-10-CM | POA: Diagnosis not present

## 2016-12-26 DIAGNOSIS — D225 Melanocytic nevi of trunk: Secondary | ICD-10-CM | POA: Diagnosis not present

## 2016-12-26 DIAGNOSIS — Z85828 Personal history of other malignant neoplasm of skin: Secondary | ICD-10-CM | POA: Diagnosis not present

## 2016-12-26 DIAGNOSIS — L821 Other seborrheic keratosis: Secondary | ICD-10-CM | POA: Diagnosis not present

## 2016-12-26 DIAGNOSIS — D2262 Melanocytic nevi of left upper limb, including shoulder: Secondary | ICD-10-CM | POA: Diagnosis not present

## 2016-12-30 DIAGNOSIS — Z01419 Encounter for gynecological examination (general) (routine) without abnormal findings: Secondary | ICD-10-CM | POA: Diagnosis not present

## 2017-03-05 DIAGNOSIS — L570 Actinic keratosis: Secondary | ICD-10-CM | POA: Diagnosis not present

## 2017-03-05 DIAGNOSIS — Z85828 Personal history of other malignant neoplasm of skin: Secondary | ICD-10-CM | POA: Diagnosis not present

## 2017-04-17 ENCOUNTER — Other Ambulatory Visit: Payer: Self-pay | Admitting: Geriatric Medicine

## 2017-04-17 DIAGNOSIS — Z1231 Encounter for screening mammogram for malignant neoplasm of breast: Secondary | ICD-10-CM

## 2017-05-26 ENCOUNTER — Ambulatory Visit: Payer: PPO

## 2017-06-16 ENCOUNTER — Ambulatory Visit
Admission: RE | Admit: 2017-06-16 | Discharge: 2017-06-16 | Disposition: A | Discharge status: Home / Self Care | Payer: PPO | Source: Ambulatory Visit | Attending: Geriatric Medicine | Admitting: Geriatric Medicine

## 2017-06-16 DIAGNOSIS — Z1231 Encounter for screening mammogram for malignant neoplasm of breast: Secondary | ICD-10-CM | POA: Diagnosis not present

## 2017-07-15 DIAGNOSIS — M8580 Other specified disorders of bone density and structure, unspecified site: Secondary | ICD-10-CM | POA: Diagnosis not present

## 2017-07-15 DIAGNOSIS — Z79899 Other long term (current) drug therapy: Secondary | ICD-10-CM | POA: Diagnosis not present

## 2017-07-15 DIAGNOSIS — Z0000 Encounter for general adult medical examination without abnormal findings: Secondary | ICD-10-CM | POA: Diagnosis not present

## 2017-07-15 DIAGNOSIS — Z1389 Encounter for screening for other disorder: Secondary | ICD-10-CM | POA: Diagnosis not present

## 2017-07-15 DIAGNOSIS — F325 Major depressive disorder, single episode, in full remission: Secondary | ICD-10-CM | POA: Diagnosis not present

## 2017-09-02 DIAGNOSIS — H01024 Squamous blepharitis left upper eyelid: Secondary | ICD-10-CM | POA: Diagnosis not present

## 2017-09-02 DIAGNOSIS — H01025 Squamous blepharitis left lower eyelid: Secondary | ICD-10-CM | POA: Diagnosis not present

## 2017-09-02 DIAGNOSIS — H01021 Squamous blepharitis right upper eyelid: Secondary | ICD-10-CM | POA: Diagnosis not present

## 2017-09-02 DIAGNOSIS — H01022 Squamous blepharitis right lower eyelid: Secondary | ICD-10-CM | POA: Diagnosis not present

## 2017-09-02 DIAGNOSIS — Z961 Presence of intraocular lens: Secondary | ICD-10-CM | POA: Diagnosis not present

## 2017-09-11 DIAGNOSIS — S8390XA Sprain of unspecified site of unspecified knee, initial encounter: Secondary | ICD-10-CM | POA: Diagnosis not present

## 2017-09-11 DIAGNOSIS — W19XXXA Unspecified fall, initial encounter: Secondary | ICD-10-CM | POA: Diagnosis not present

## 2017-09-11 DIAGNOSIS — M25561 Pain in right knee: Secondary | ICD-10-CM | POA: Diagnosis not present

## 2017-11-05 DIAGNOSIS — D0461 Carcinoma in situ of skin of right upper limb, including shoulder: Secondary | ICD-10-CM | POA: Diagnosis not present

## 2017-11-05 DIAGNOSIS — C44722 Squamous cell carcinoma of skin of right lower limb, including hip: Secondary | ICD-10-CM | POA: Diagnosis not present

## 2017-11-05 DIAGNOSIS — Z85828 Personal history of other malignant neoplasm of skin: Secondary | ICD-10-CM | POA: Diagnosis not present

## 2017-11-05 DIAGNOSIS — C44712 Basal cell carcinoma of skin of right lower limb, including hip: Secondary | ICD-10-CM | POA: Diagnosis not present

## 2017-11-05 DIAGNOSIS — D045 Carcinoma in situ of skin of trunk: Secondary | ICD-10-CM | POA: Diagnosis not present

## 2017-11-05 DIAGNOSIS — D485 Neoplasm of uncertain behavior of skin: Secondary | ICD-10-CM | POA: Diagnosis not present

## 2017-11-20 DIAGNOSIS — Z85828 Personal history of other malignant neoplasm of skin: Secondary | ICD-10-CM | POA: Diagnosis not present

## 2017-11-20 DIAGNOSIS — D045 Carcinoma in situ of skin of trunk: Secondary | ICD-10-CM | POA: Diagnosis not present

## 2018-01-04 DIAGNOSIS — Z01419 Encounter for gynecological examination (general) (routine) without abnormal findings: Secondary | ICD-10-CM | POA: Diagnosis not present

## 2018-02-04 DIAGNOSIS — L814 Other melanin hyperpigmentation: Secondary | ICD-10-CM | POA: Diagnosis not present

## 2018-02-04 DIAGNOSIS — L910 Hypertrophic scar: Secondary | ICD-10-CM | POA: Diagnosis not present

## 2018-02-04 DIAGNOSIS — L821 Other seborrheic keratosis: Secondary | ICD-10-CM | POA: Diagnosis not present

## 2018-02-04 DIAGNOSIS — L570 Actinic keratosis: Secondary | ICD-10-CM | POA: Diagnosis not present

## 2018-02-04 DIAGNOSIS — Z85828 Personal history of other malignant neoplasm of skin: Secondary | ICD-10-CM | POA: Diagnosis not present

## 2018-03-09 DIAGNOSIS — Z961 Presence of intraocular lens: Secondary | ICD-10-CM | POA: Diagnosis not present

## 2018-03-09 DIAGNOSIS — H04123 Dry eye syndrome of bilateral lacrimal glands: Secondary | ICD-10-CM | POA: Diagnosis not present

## 2018-03-09 DIAGNOSIS — H26493 Other secondary cataract, bilateral: Secondary | ICD-10-CM | POA: Diagnosis not present

## 2018-03-09 DIAGNOSIS — H01022 Squamous blepharitis right lower eyelid: Secondary | ICD-10-CM | POA: Diagnosis not present

## 2018-03-09 DIAGNOSIS — H01024 Squamous blepharitis left upper eyelid: Secondary | ICD-10-CM | POA: Diagnosis not present

## 2018-03-09 DIAGNOSIS — H1851 Endothelial corneal dystrophy: Secondary | ICD-10-CM | POA: Diagnosis not present

## 2018-03-09 DIAGNOSIS — H01021 Squamous blepharitis right upper eyelid: Secondary | ICD-10-CM | POA: Diagnosis not present

## 2018-03-09 DIAGNOSIS — H01025 Squamous blepharitis left lower eyelid: Secondary | ICD-10-CM | POA: Diagnosis not present

## 2018-05-14 ENCOUNTER — Other Ambulatory Visit: Payer: Self-pay | Admitting: Geriatric Medicine

## 2018-05-14 DIAGNOSIS — Z1231 Encounter for screening mammogram for malignant neoplasm of breast: Secondary | ICD-10-CM

## 2018-05-21 DIAGNOSIS — M546 Pain in thoracic spine: Secondary | ICD-10-CM | POA: Diagnosis not present

## 2018-06-18 ENCOUNTER — Other Ambulatory Visit: Payer: Self-pay

## 2018-06-18 ENCOUNTER — Ambulatory Visit
Admission: RE | Admit: 2018-06-18 | Discharge: 2018-06-18 | Disposition: A | Discharge status: Home / Self Care | Payer: PPO | Source: Ambulatory Visit | Attending: Geriatric Medicine | Admitting: Geriatric Medicine

## 2018-06-18 DIAGNOSIS — Z1231 Encounter for screening mammogram for malignant neoplasm of breast: Secondary | ICD-10-CM | POA: Diagnosis not present

## 2018-06-29 DIAGNOSIS — J069 Acute upper respiratory infection, unspecified: Secondary | ICD-10-CM | POA: Diagnosis not present

## 2018-07-22 DIAGNOSIS — J069 Acute upper respiratory infection, unspecified: Secondary | ICD-10-CM | POA: Diagnosis not present

## 2018-07-22 DIAGNOSIS — Z0000 Encounter for general adult medical examination without abnormal findings: Secondary | ICD-10-CM | POA: Diagnosis not present

## 2018-07-22 DIAGNOSIS — F325 Major depressive disorder, single episode, in full remission: Secondary | ICD-10-CM | POA: Diagnosis not present

## 2018-09-28 DIAGNOSIS — C44622 Squamous cell carcinoma of skin of right upper limb, including shoulder: Secondary | ICD-10-CM | POA: Diagnosis not present

## 2018-09-28 DIAGNOSIS — D485 Neoplasm of uncertain behavior of skin: Secondary | ICD-10-CM | POA: Diagnosis not present

## 2018-09-28 DIAGNOSIS — Z85828 Personal history of other malignant neoplasm of skin: Secondary | ICD-10-CM | POA: Diagnosis not present

## 2018-09-28 DIAGNOSIS — L570 Actinic keratosis: Secondary | ICD-10-CM | POA: Diagnosis not present

## 2019-01-06 DIAGNOSIS — L03011 Cellulitis of right finger: Secondary | ICD-10-CM | POA: Diagnosis not present

## 2019-01-07 DIAGNOSIS — L03011 Cellulitis of right finger: Secondary | ICD-10-CM | POA: Diagnosis not present

## 2019-01-12 DIAGNOSIS — L03011 Cellulitis of right finger: Secondary | ICD-10-CM | POA: Diagnosis not present

## 2019-01-12 DIAGNOSIS — Z85828 Personal history of other malignant neoplasm of skin: Secondary | ICD-10-CM | POA: Diagnosis not present

## 2019-03-10 DIAGNOSIS — Z961 Presence of intraocular lens: Secondary | ICD-10-CM | POA: Diagnosis not present

## 2019-03-10 DIAGNOSIS — H0102A Squamous blepharitis right eye, upper and lower eyelids: Secondary | ICD-10-CM | POA: Diagnosis not present

## 2019-03-10 DIAGNOSIS — H26493 Other secondary cataract, bilateral: Secondary | ICD-10-CM | POA: Diagnosis not present

## 2019-03-10 DIAGNOSIS — H04123 Dry eye syndrome of bilateral lacrimal glands: Secondary | ICD-10-CM | POA: Diagnosis not present

## 2019-03-10 DIAGNOSIS — H18513 Endothelial corneal dystrophy, bilateral: Secondary | ICD-10-CM | POA: Diagnosis not present

## 2019-03-10 DIAGNOSIS — H0102B Squamous blepharitis left eye, upper and lower eyelids: Secondary | ICD-10-CM | POA: Diagnosis not present

## 2019-03-30 DIAGNOSIS — D692 Other nonthrombocytopenic purpura: Secondary | ICD-10-CM | POA: Diagnosis not present

## 2019-03-30 DIAGNOSIS — Z85828 Personal history of other malignant neoplasm of skin: Secondary | ICD-10-CM | POA: Diagnosis not present

## 2019-03-30 DIAGNOSIS — D1801 Hemangioma of skin and subcutaneous tissue: Secondary | ICD-10-CM | POA: Diagnosis not present

## 2019-03-30 DIAGNOSIS — D224 Melanocytic nevi of scalp and neck: Secondary | ICD-10-CM | POA: Diagnosis not present

## 2019-03-30 DIAGNOSIS — D2262 Melanocytic nevi of left upper limb, including shoulder: Secondary | ICD-10-CM | POA: Diagnosis not present

## 2019-03-30 DIAGNOSIS — L821 Other seborrheic keratosis: Secondary | ICD-10-CM | POA: Diagnosis not present

## 2019-03-30 DIAGNOSIS — L814 Other melanin hyperpigmentation: Secondary | ICD-10-CM | POA: Diagnosis not present

## 2019-03-30 DIAGNOSIS — L570 Actinic keratosis: Secondary | ICD-10-CM | POA: Diagnosis not present

## 2019-05-04 DIAGNOSIS — H01131 Eczematous dermatitis of right upper eyelid: Secondary | ICD-10-CM | POA: Diagnosis not present

## 2019-05-04 DIAGNOSIS — H01132 Eczematous dermatitis of right lower eyelid: Secondary | ICD-10-CM | POA: Diagnosis not present

## 2019-05-04 DIAGNOSIS — H01135 Eczematous dermatitis of left lower eyelid: Secondary | ICD-10-CM | POA: Diagnosis not present

## 2019-05-04 DIAGNOSIS — H01134 Eczematous dermatitis of left upper eyelid: Secondary | ICD-10-CM | POA: Diagnosis not present

## 2019-05-20 ENCOUNTER — Other Ambulatory Visit: Payer: Self-pay | Admitting: Geriatric Medicine

## 2019-05-20 DIAGNOSIS — Z1231 Encounter for screening mammogram for malignant neoplasm of breast: Secondary | ICD-10-CM

## 2019-06-17 DIAGNOSIS — S43402A Unspecified sprain of left shoulder joint, initial encounter: Secondary | ICD-10-CM | POA: Diagnosis not present

## 2019-06-17 DIAGNOSIS — M67912 Unspecified disorder of synovium and tendon, left shoulder: Secondary | ICD-10-CM | POA: Diagnosis not present

## 2019-06-28 ENCOUNTER — Ambulatory Visit: Payer: PPO

## 2019-07-25 DIAGNOSIS — F325 Major depressive disorder, single episode, in full remission: Secondary | ICD-10-CM | POA: Diagnosis not present

## 2019-07-25 DIAGNOSIS — Z79899 Other long term (current) drug therapy: Secondary | ICD-10-CM | POA: Diagnosis not present

## 2019-07-25 DIAGNOSIS — Z0000 Encounter for general adult medical examination without abnormal findings: Secondary | ICD-10-CM | POA: Diagnosis not present

## 2019-08-01 ENCOUNTER — Other Ambulatory Visit: Payer: Self-pay

## 2019-08-01 ENCOUNTER — Ambulatory Visit
Admission: RE | Admit: 2019-08-01 | Discharge: 2019-08-01 | Disposition: A | Discharge status: Home / Self Care | Payer: PPO | Source: Ambulatory Visit | Attending: Geriatric Medicine | Admitting: Geriatric Medicine

## 2019-08-01 DIAGNOSIS — Z1231 Encounter for screening mammogram for malignant neoplasm of breast: Secondary | ICD-10-CM

## 2019-11-09 DIAGNOSIS — R519 Headache, unspecified: Secondary | ICD-10-CM | POA: Diagnosis not present

## 2019-11-09 DIAGNOSIS — R05 Cough: Secondary | ICD-10-CM | POA: Diagnosis not present

## 2019-11-09 DIAGNOSIS — R0981 Nasal congestion: Secondary | ICD-10-CM | POA: Diagnosis not present

## 2019-11-09 DIAGNOSIS — R61 Generalized hyperhidrosis: Secondary | ICD-10-CM | POA: Diagnosis not present

## 2019-11-09 DIAGNOSIS — R52 Pain, unspecified: Secondary | ICD-10-CM | POA: Diagnosis not present

## 2019-11-10 DIAGNOSIS — R61 Generalized hyperhidrosis: Secondary | ICD-10-CM | POA: Diagnosis not present

## 2019-11-10 DIAGNOSIS — R52 Pain, unspecified: Secondary | ICD-10-CM | POA: Diagnosis not present

## 2019-12-26 DIAGNOSIS — Z1159 Encounter for screening for other viral diseases: Secondary | ICD-10-CM | POA: Diagnosis not present

## 2019-12-29 DIAGNOSIS — Z1211 Encounter for screening for malignant neoplasm of colon: Secondary | ICD-10-CM | POA: Diagnosis not present

## 2019-12-29 DIAGNOSIS — K5730 Diverticulosis of large intestine without perforation or abscess without bleeding: Secondary | ICD-10-CM | POA: Diagnosis not present

## 2019-12-29 DIAGNOSIS — D122 Benign neoplasm of ascending colon: Secondary | ICD-10-CM | POA: Diagnosis not present

## 2019-12-29 DIAGNOSIS — K635 Polyp of colon: Secondary | ICD-10-CM | POA: Diagnosis not present

## 2019-12-30 DIAGNOSIS — Z85828 Personal history of other malignant neoplasm of skin: Secondary | ICD-10-CM | POA: Diagnosis not present

## 2019-12-30 DIAGNOSIS — C44629 Squamous cell carcinoma of skin of left upper limb, including shoulder: Secondary | ICD-10-CM | POA: Diagnosis not present

## 2019-12-30 DIAGNOSIS — D485 Neoplasm of uncertain behavior of skin: Secondary | ICD-10-CM | POA: Diagnosis not present

## 2019-12-30 DIAGNOSIS — C44529 Squamous cell carcinoma of skin of other part of trunk: Secondary | ICD-10-CM | POA: Diagnosis not present

## 2019-12-30 DIAGNOSIS — C44622 Squamous cell carcinoma of skin of right upper limb, including shoulder: Secondary | ICD-10-CM | POA: Diagnosis not present

## 2020-01-03 DIAGNOSIS — D122 Benign neoplasm of ascending colon: Secondary | ICD-10-CM | POA: Diagnosis not present

## 2020-01-03 DIAGNOSIS — K635 Polyp of colon: Secondary | ICD-10-CM | POA: Diagnosis not present

## 2020-05-17 DIAGNOSIS — Z124 Encounter for screening for malignant neoplasm of cervix: Secondary | ICD-10-CM | POA: Diagnosis not present

## 2020-05-17 DIAGNOSIS — M8580 Other specified disorders of bone density and structure, unspecified site: Secondary | ICD-10-CM | POA: Diagnosis not present

## 2020-05-17 DIAGNOSIS — Z6825 Body mass index (BMI) 25.0-25.9, adult: Secondary | ICD-10-CM | POA: Diagnosis not present

## 2020-05-17 DIAGNOSIS — Z01411 Encounter for gynecological examination (general) (routine) with abnormal findings: Secondary | ICD-10-CM | POA: Diagnosis not present

## 2020-05-17 DIAGNOSIS — Z01419 Encounter for gynecological examination (general) (routine) without abnormal findings: Secondary | ICD-10-CM | POA: Diagnosis not present

## 2020-06-19 DIAGNOSIS — L820 Inflamed seborrheic keratosis: Secondary | ICD-10-CM | POA: Diagnosis not present

## 2020-06-19 DIAGNOSIS — D1801 Hemangioma of skin and subcutaneous tissue: Secondary | ICD-10-CM | POA: Diagnosis not present

## 2020-06-19 DIAGNOSIS — D2262 Melanocytic nevi of left upper limb, including shoulder: Secondary | ICD-10-CM | POA: Diagnosis not present

## 2020-06-19 DIAGNOSIS — L821 Other seborrheic keratosis: Secondary | ICD-10-CM | POA: Diagnosis not present

## 2020-06-19 DIAGNOSIS — Z85828 Personal history of other malignant neoplasm of skin: Secondary | ICD-10-CM | POA: Diagnosis not present

## 2020-06-19 DIAGNOSIS — L814 Other melanin hyperpigmentation: Secondary | ICD-10-CM | POA: Diagnosis not present

## 2020-06-19 DIAGNOSIS — L570 Actinic keratosis: Secondary | ICD-10-CM | POA: Diagnosis not present

## 2020-06-29 ENCOUNTER — Other Ambulatory Visit: Payer: Self-pay | Admitting: Geriatric Medicine

## 2020-06-29 DIAGNOSIS — Z1231 Encounter for screening mammogram for malignant neoplasm of breast: Secondary | ICD-10-CM

## 2020-07-10 DIAGNOSIS — H0102A Squamous blepharitis right eye, upper and lower eyelids: Secondary | ICD-10-CM | POA: Diagnosis not present

## 2020-07-10 DIAGNOSIS — H18513 Endothelial corneal dystrophy, bilateral: Secondary | ICD-10-CM | POA: Diagnosis not present

## 2020-07-10 DIAGNOSIS — H04123 Dry eye syndrome of bilateral lacrimal glands: Secondary | ICD-10-CM | POA: Diagnosis not present

## 2020-07-10 DIAGNOSIS — D3131 Benign neoplasm of right choroid: Secondary | ICD-10-CM | POA: Diagnosis not present

## 2020-07-10 DIAGNOSIS — H26491 Other secondary cataract, right eye: Secondary | ICD-10-CM | POA: Diagnosis not present

## 2020-07-10 DIAGNOSIS — H0102B Squamous blepharitis left eye, upper and lower eyelids: Secondary | ICD-10-CM | POA: Diagnosis not present

## 2020-07-10 DIAGNOSIS — Z961 Presence of intraocular lens: Secondary | ICD-10-CM | POA: Diagnosis not present

## 2020-07-31 DIAGNOSIS — Z1389 Encounter for screening for other disorder: Secondary | ICD-10-CM | POA: Diagnosis not present

## 2020-07-31 DIAGNOSIS — Z136 Encounter for screening for cardiovascular disorders: Secondary | ICD-10-CM | POA: Diagnosis not present

## 2020-07-31 DIAGNOSIS — Z1159 Encounter for screening for other viral diseases: Secondary | ICD-10-CM | POA: Diagnosis not present

## 2020-07-31 DIAGNOSIS — F325 Major depressive disorder, single episode, in full remission: Secondary | ICD-10-CM | POA: Diagnosis not present

## 2020-07-31 DIAGNOSIS — M7062 Trochanteric bursitis, left hip: Secondary | ICD-10-CM | POA: Diagnosis not present

## 2020-07-31 DIAGNOSIS — M8580 Other specified disorders of bone density and structure, unspecified site: Secondary | ICD-10-CM | POA: Diagnosis not present

## 2020-07-31 DIAGNOSIS — Z780 Asymptomatic menopausal state: Secondary | ICD-10-CM | POA: Diagnosis not present

## 2020-07-31 DIAGNOSIS — Z79899 Other long term (current) drug therapy: Secondary | ICD-10-CM | POA: Diagnosis not present

## 2020-07-31 DIAGNOSIS — Z0000 Encounter for general adult medical examination without abnormal findings: Secondary | ICD-10-CM | POA: Diagnosis not present

## 2020-08-02 ENCOUNTER — Ambulatory Visit
Admission: RE | Admit: 2020-08-02 | Discharge: 2020-08-02 | Disposition: A | Discharge status: Home / Self Care | Payer: PPO | Source: Ambulatory Visit | Attending: Geriatric Medicine | Admitting: Geriatric Medicine

## 2020-08-02 ENCOUNTER — Other Ambulatory Visit: Payer: Self-pay | Admitting: Geriatric Medicine

## 2020-08-02 ENCOUNTER — Other Ambulatory Visit: Payer: Self-pay

## 2020-08-02 DIAGNOSIS — Z780 Asymptomatic menopausal state: Secondary | ICD-10-CM | POA: Diagnosis not present

## 2020-08-02 DIAGNOSIS — M8580 Other specified disorders of bone density and structure, unspecified site: Secondary | ICD-10-CM

## 2020-08-02 DIAGNOSIS — M810 Age-related osteoporosis without current pathological fracture: Secondary | ICD-10-CM | POA: Diagnosis not present

## 2020-08-02 DIAGNOSIS — M8589 Other specified disorders of bone density and structure, multiple sites: Secondary | ICD-10-CM | POA: Diagnosis not present

## 2020-08-08 DIAGNOSIS — M810 Age-related osteoporosis without current pathological fracture: Secondary | ICD-10-CM | POA: Diagnosis not present

## 2020-08-21 ENCOUNTER — Ambulatory Visit
Admission: RE | Admit: 2020-08-21 | Discharge: 2020-08-21 | Disposition: A | Discharge status: Home / Self Care | Payer: PPO | Source: Ambulatory Visit | Attending: Geriatric Medicine | Admitting: Geriatric Medicine

## 2020-08-21 ENCOUNTER — Other Ambulatory Visit: Payer: Self-pay

## 2020-08-21 DIAGNOSIS — Z1231 Encounter for screening mammogram for malignant neoplasm of breast: Secondary | ICD-10-CM | POA: Diagnosis not present

## 2020-08-22 ENCOUNTER — Other Ambulatory Visit: Payer: Self-pay | Admitting: Geriatric Medicine

## 2020-08-22 DIAGNOSIS — R928 Other abnormal and inconclusive findings on diagnostic imaging of breast: Secondary | ICD-10-CM

## 2020-09-21 ENCOUNTER — Ambulatory Visit: Admission: RE | Admit: 2020-09-21 | Payer: PPO | Source: Ambulatory Visit

## 2020-09-21 ENCOUNTER — Ambulatory Visit
Admission: RE | Admit: 2020-09-21 | Discharge: 2020-09-21 | Disposition: A | Discharge status: Home / Self Care | Payer: PPO | Source: Ambulatory Visit | Attending: Geriatric Medicine | Admitting: Geriatric Medicine

## 2020-09-21 ENCOUNTER — Other Ambulatory Visit: Payer: Self-pay

## 2020-09-21 DIAGNOSIS — R928 Other abnormal and inconclusive findings on diagnostic imaging of breast: Secondary | ICD-10-CM | POA: Diagnosis not present

## 2020-09-21 DIAGNOSIS — R922 Inconclusive mammogram: Secondary | ICD-10-CM | POA: Diagnosis not present

## 2020-10-27 IMAGING — MG DIGITAL SCREENING BILATERAL MAMMOGRAM WITH TOMO AND CAD
8 series · 9 of 24 positions shown · non-contrast
Comparison: Previous exam(s).

CLINICAL DATA: Screening.

EXAM:
DIGITAL SCREENING BILATERAL MAMMOGRAM WITH TOMO AND CAD

[L CC synth-2D]
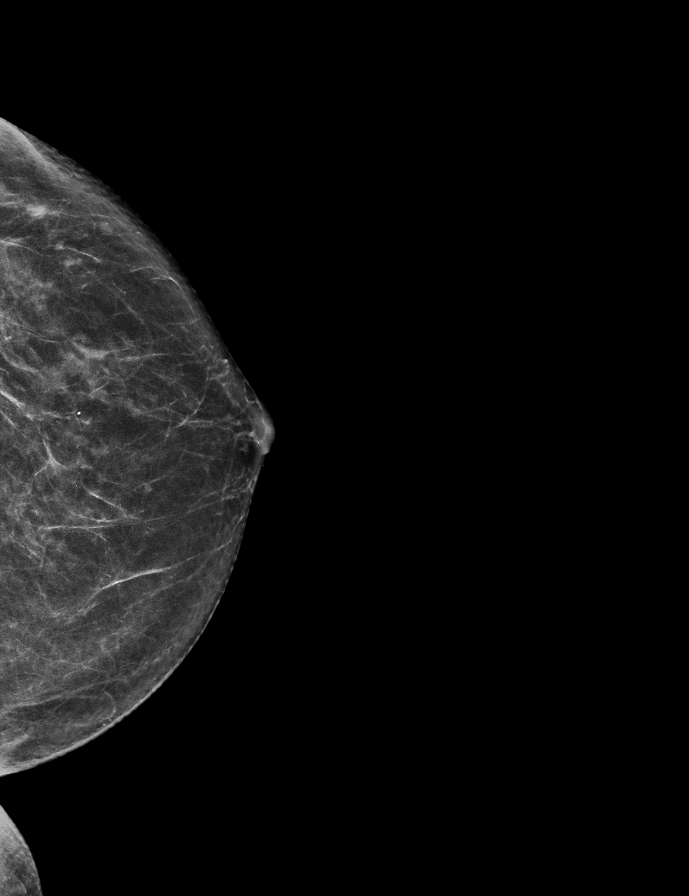

[R MLO synth-2D]
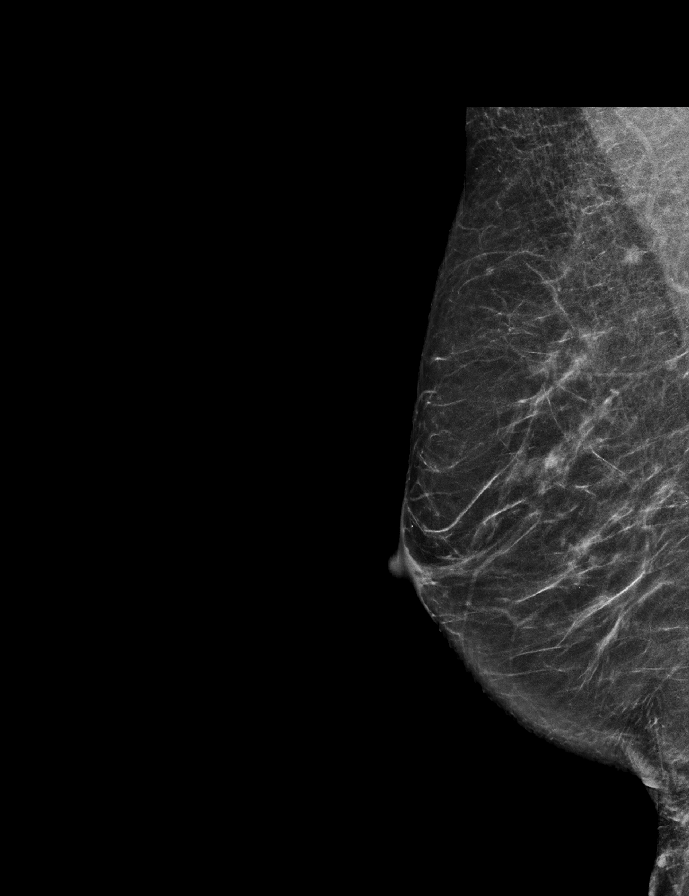

[R CC synth-2D]
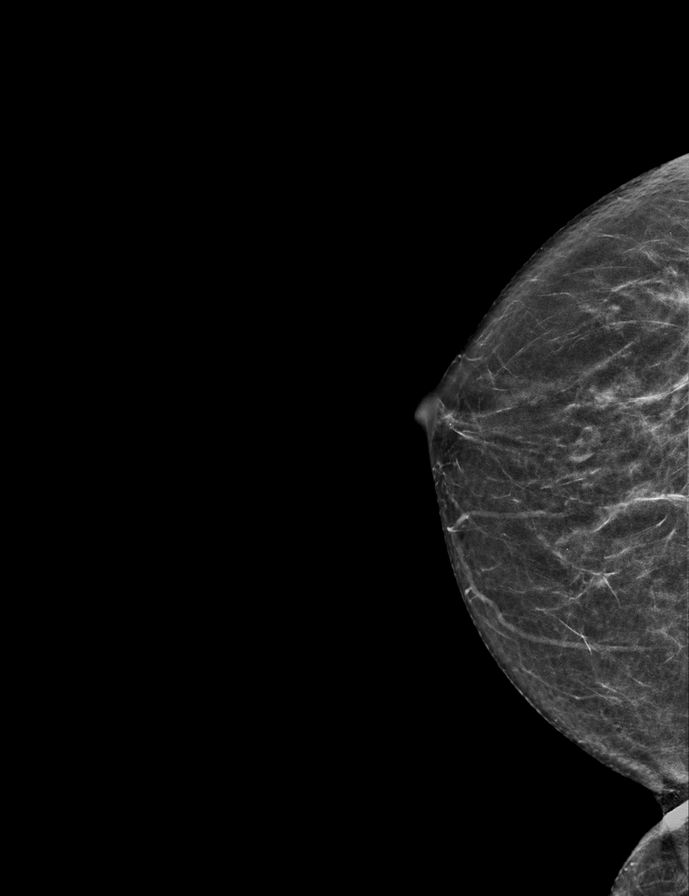

[L MLO synth-2D]
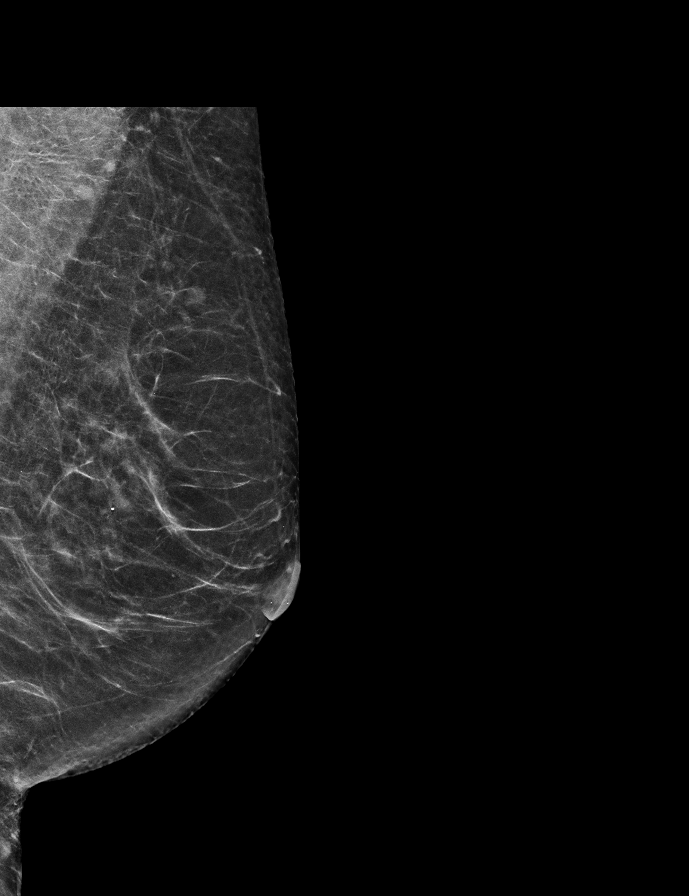

[R CC tomo · 2 of 55 frames shown]
[frame 18/55]
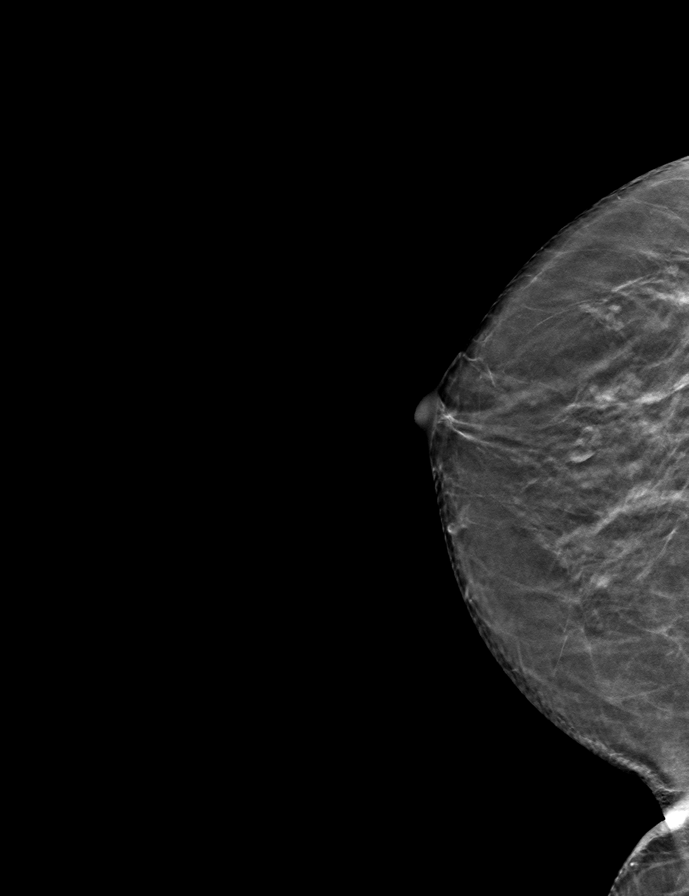
[frame 28/55]
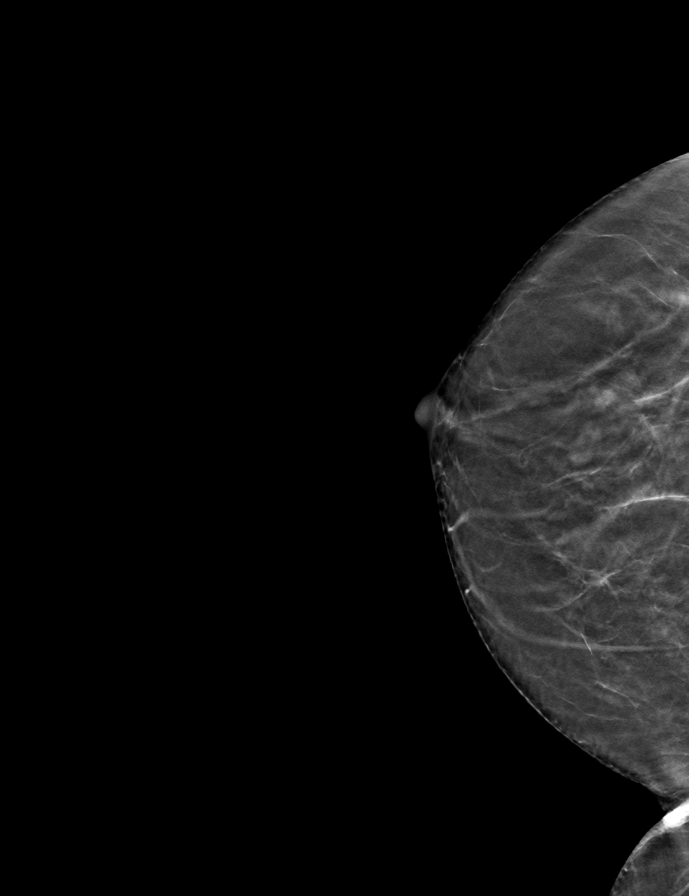

[L CC tomo · tomo slice 29/58.0]
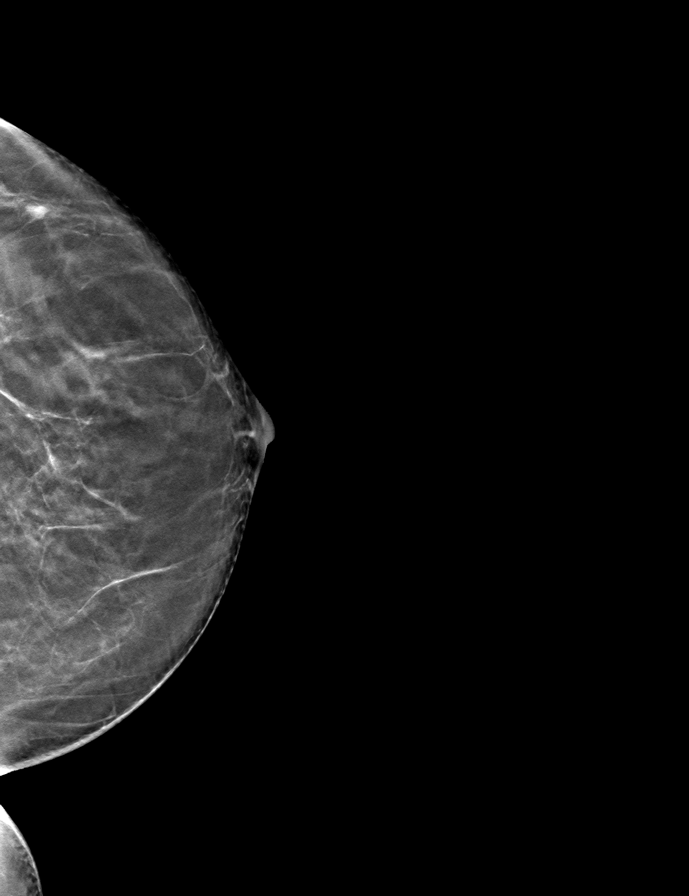

[R MLO tomo · tomo slice 30/59.0]
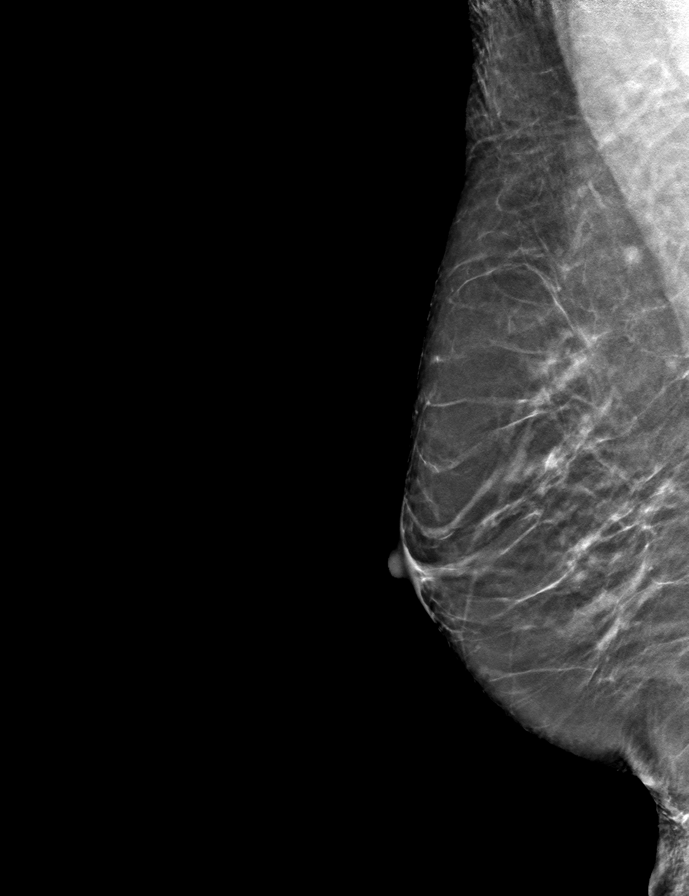

[L MLO tomo · tomo slice 30/59.0]
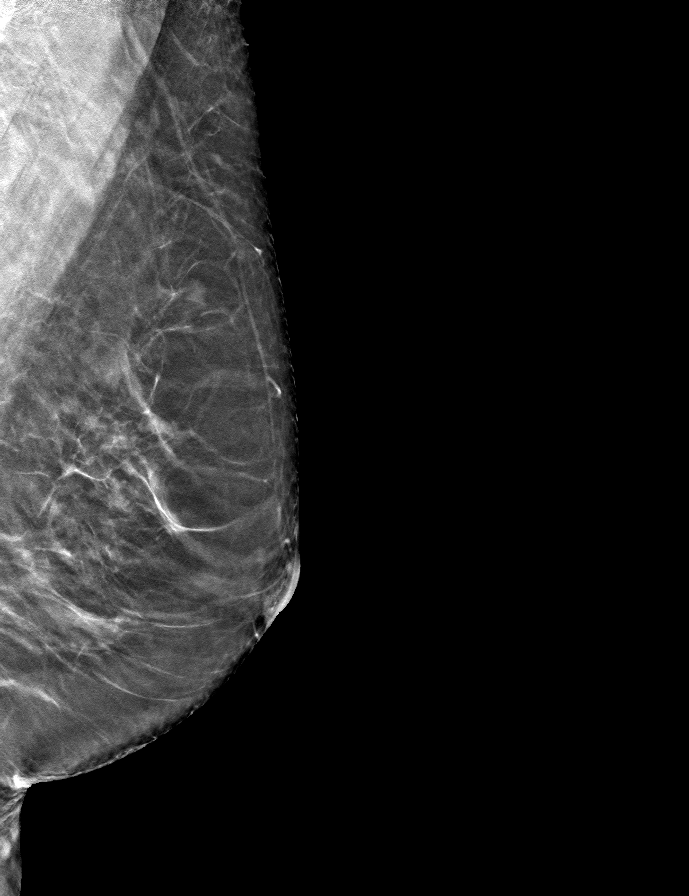

[9 of 24 positions shown; findings below may reference images not displayed]

ACR Breast Density Category b: There are scattered areas of
fibroglandular density.
FINDINGS: There are no findings suspicious for malignancy. Images were
processed with CAD.
IMPRESSION: No mammographic evidence of malignancy. A result letter of this
screening mammogram will be mailed directly to the patient.

RECOMMENDATION:
Screening mammogram in one year. (Code:[id number])

BI-RADS CATEGORY  1: Negative.

## 2020-12-13 DIAGNOSIS — Z79899 Other long term (current) drug therapy: Secondary | ICD-10-CM | POA: Diagnosis not present

## 2020-12-13 DIAGNOSIS — E7800 Pure hypercholesterolemia, unspecified: Secondary | ICD-10-CM | POA: Diagnosis not present

## 2021-02-07 DIAGNOSIS — M461 Sacroiliitis, not elsewhere classified: Secondary | ICD-10-CM | POA: Diagnosis not present

## 2021-03-20 DIAGNOSIS — M5441 Lumbago with sciatica, right side: Secondary | ICD-10-CM | POA: Diagnosis not present

## 2021-03-20 DIAGNOSIS — M5442 Lumbago with sciatica, left side: Secondary | ICD-10-CM | POA: Diagnosis not present

## 2021-03-26 DIAGNOSIS — M5416 Radiculopathy, lumbar region: Secondary | ICD-10-CM | POA: Diagnosis not present

## 2021-03-26 DIAGNOSIS — M79605 Pain in left leg: Secondary | ICD-10-CM | POA: Diagnosis not present

## 2021-03-26 DIAGNOSIS — R531 Weakness: Secondary | ICD-10-CM | POA: Diagnosis not present

## 2021-04-03 DIAGNOSIS — M5416 Radiculopathy, lumbar region: Secondary | ICD-10-CM | POA: Diagnosis not present

## 2021-04-03 DIAGNOSIS — M79605 Pain in left leg: Secondary | ICD-10-CM | POA: Diagnosis not present

## 2021-04-03 DIAGNOSIS — R531 Weakness: Secondary | ICD-10-CM | POA: Diagnosis not present

## 2021-04-11 DIAGNOSIS — M79605 Pain in left leg: Secondary | ICD-10-CM | POA: Diagnosis not present

## 2021-04-11 DIAGNOSIS — M5416 Radiculopathy, lumbar region: Secondary | ICD-10-CM | POA: Diagnosis not present

## 2021-04-11 DIAGNOSIS — R531 Weakness: Secondary | ICD-10-CM | POA: Diagnosis not present

## 2021-04-15 DIAGNOSIS — R531 Weakness: Secondary | ICD-10-CM | POA: Diagnosis not present

## 2021-04-15 DIAGNOSIS — M5416 Radiculopathy, lumbar region: Secondary | ICD-10-CM | POA: Diagnosis not present

## 2021-04-15 DIAGNOSIS — M79605 Pain in left leg: Secondary | ICD-10-CM | POA: Diagnosis not present

## 2021-04-17 DIAGNOSIS — R531 Weakness: Secondary | ICD-10-CM | POA: Diagnosis not present

## 2021-04-17 DIAGNOSIS — M5416 Radiculopathy, lumbar region: Secondary | ICD-10-CM | POA: Diagnosis not present

## 2021-04-17 DIAGNOSIS — M79605 Pain in left leg: Secondary | ICD-10-CM | POA: Diagnosis not present

## 2021-04-19 DIAGNOSIS — M545 Low back pain, unspecified: Secondary | ICD-10-CM | POA: Diagnosis not present

## 2021-04-26 DIAGNOSIS — M5416 Radiculopathy, lumbar region: Secondary | ICD-10-CM | POA: Diagnosis not present

## 2021-04-29 DIAGNOSIS — M545 Low back pain, unspecified: Secondary | ICD-10-CM | POA: Diagnosis not present

## 2021-05-06 DIAGNOSIS — M48062 Spinal stenosis, lumbar region with neurogenic claudication: Secondary | ICD-10-CM | POA: Diagnosis not present

## 2021-05-22 DIAGNOSIS — M48062 Spinal stenosis, lumbar region with neurogenic claudication: Secondary | ICD-10-CM | POA: Diagnosis not present

## 2021-06-06 DIAGNOSIS — L820 Inflamed seborrheic keratosis: Secondary | ICD-10-CM | POA: Diagnosis not present

## 2021-06-06 DIAGNOSIS — Z85828 Personal history of other malignant neoplasm of skin: Secondary | ICD-10-CM | POA: Diagnosis not present

## 2021-06-06 DIAGNOSIS — L570 Actinic keratosis: Secondary | ICD-10-CM | POA: Diagnosis not present

## 2021-06-06 DIAGNOSIS — D1801 Hemangioma of skin and subcutaneous tissue: Secondary | ICD-10-CM | POA: Diagnosis not present

## 2021-06-06 DIAGNOSIS — D225 Melanocytic nevi of trunk: Secondary | ICD-10-CM | POA: Diagnosis not present

## 2021-06-06 DIAGNOSIS — L814 Other melanin hyperpigmentation: Secondary | ICD-10-CM | POA: Diagnosis not present

## 2021-06-06 DIAGNOSIS — D2262 Melanocytic nevi of left upper limb, including shoulder: Secondary | ICD-10-CM | POA: Diagnosis not present

## 2021-06-06 DIAGNOSIS — L821 Other seborrheic keratosis: Secondary | ICD-10-CM | POA: Diagnosis not present

## 2021-06-12 DIAGNOSIS — M48062 Spinal stenosis, lumbar region with neurogenic claudication: Secondary | ICD-10-CM | POA: Diagnosis not present

## 2021-07-08 DIAGNOSIS — M48062 Spinal stenosis, lumbar region with neurogenic claudication: Secondary | ICD-10-CM | POA: Diagnosis not present

## 2021-07-11 DIAGNOSIS — H0102B Squamous blepharitis left eye, upper and lower eyelids: Secondary | ICD-10-CM | POA: Diagnosis not present

## 2021-07-11 DIAGNOSIS — H26491 Other secondary cataract, right eye: Secondary | ICD-10-CM | POA: Diagnosis not present

## 2021-07-11 DIAGNOSIS — Z961 Presence of intraocular lens: Secondary | ICD-10-CM | POA: Diagnosis not present

## 2021-07-11 DIAGNOSIS — D3131 Benign neoplasm of right choroid: Secondary | ICD-10-CM | POA: Diagnosis not present

## 2021-07-11 DIAGNOSIS — H18513 Endothelial corneal dystrophy, bilateral: Secondary | ICD-10-CM | POA: Diagnosis not present

## 2021-07-11 DIAGNOSIS — H04123 Dry eye syndrome of bilateral lacrimal glands: Secondary | ICD-10-CM | POA: Diagnosis not present

## 2021-07-11 DIAGNOSIS — H0102A Squamous blepharitis right eye, upper and lower eyelids: Secondary | ICD-10-CM | POA: Diagnosis not present

## 2021-07-22 DIAGNOSIS — M48061 Spinal stenosis, lumbar region without neurogenic claudication: Secondary | ICD-10-CM | POA: Diagnosis not present

## 2021-08-06 DIAGNOSIS — M48061 Spinal stenosis, lumbar region without neurogenic claudication: Secondary | ICD-10-CM | POA: Diagnosis not present

## 2021-08-06 DIAGNOSIS — M5136 Other intervertebral disc degeneration, lumbar region: Secondary | ICD-10-CM | POA: Diagnosis not present

## 2021-08-06 DIAGNOSIS — M5126 Other intervertebral disc displacement, lumbar region: Secondary | ICD-10-CM | POA: Diagnosis not present

## 2021-08-06 DIAGNOSIS — G9609 Other spinal cerebrospinal fluid leak: Secondary | ICD-10-CM | POA: Diagnosis not present

## 2021-08-06 DIAGNOSIS — M48062 Spinal stenosis, lumbar region with neurogenic claudication: Secondary | ICD-10-CM | POA: Diagnosis not present

## 2021-08-29 ENCOUNTER — Other Ambulatory Visit: Payer: Self-pay | Admitting: Geriatric Medicine

## 2021-08-29 DIAGNOSIS — Z1231 Encounter for screening mammogram for malignant neoplasm of breast: Secondary | ICD-10-CM

## 2021-09-23 ENCOUNTER — Ambulatory Visit: Payer: PPO

## 2021-10-09 ENCOUNTER — Ambulatory Visit
Admission: RE | Admit: 2021-10-09 | Discharge: 2021-10-09 | Disposition: A | Discharge status: Home / Self Care | Payer: PPO | Source: Ambulatory Visit | Attending: Geriatric Medicine | Admitting: Geriatric Medicine

## 2021-10-09 DIAGNOSIS — Z1231 Encounter for screening mammogram for malignant neoplasm of breast: Secondary | ICD-10-CM | POA: Diagnosis not present

## 2021-10-11 ENCOUNTER — Other Ambulatory Visit: Payer: Self-pay | Admitting: Geriatric Medicine

## 2021-10-11 DIAGNOSIS — R928 Other abnormal and inconclusive findings on diagnostic imaging of breast: Secondary | ICD-10-CM

## 2021-10-17 ENCOUNTER — Ambulatory Visit
Admission: RE | Admit: 2021-10-17 | Discharge: 2021-10-17 | Disposition: A | Discharge status: Home / Self Care | Payer: PPO | Source: Ambulatory Visit | Attending: Geriatric Medicine | Admitting: Geriatric Medicine

## 2021-10-17 ENCOUNTER — Ambulatory Visit: Payer: PPO

## 2021-10-17 DIAGNOSIS — R928 Other abnormal and inconclusive findings on diagnostic imaging of breast: Secondary | ICD-10-CM | POA: Diagnosis not present

## 2021-12-02 DIAGNOSIS — Z0000 Encounter for general adult medical examination without abnormal findings: Secondary | ICD-10-CM | POA: Diagnosis not present

## 2021-12-02 DIAGNOSIS — E7800 Pure hypercholesterolemia, unspecified: Secondary | ICD-10-CM | POA: Diagnosis not present

## 2021-12-02 DIAGNOSIS — Z79899 Other long term (current) drug therapy: Secondary | ICD-10-CM | POA: Diagnosis not present

## 2021-12-02 DIAGNOSIS — F325 Major depressive disorder, single episode, in full remission: Secondary | ICD-10-CM | POA: Diagnosis not present

## 2021-12-02 DIAGNOSIS — M810 Age-related osteoporosis without current pathological fracture: Secondary | ICD-10-CM | POA: Diagnosis not present

## 2022-01-27 DIAGNOSIS — Z6825 Body mass index (BMI) 25.0-25.9, adult: Secondary | ICD-10-CM | POA: Diagnosis not present

## 2022-01-27 DIAGNOSIS — Z01419 Encounter for gynecological examination (general) (routine) without abnormal findings: Secondary | ICD-10-CM | POA: Diagnosis not present

## 2022-01-27 DIAGNOSIS — Z124 Encounter for screening for malignant neoplasm of cervix: Secondary | ICD-10-CM | POA: Diagnosis not present

## 2022-01-27 DIAGNOSIS — M8580 Other specified disorders of bone density and structure, unspecified site: Secondary | ICD-10-CM | POA: Diagnosis not present

## 2022-01-27 DIAGNOSIS — Z01411 Encounter for gynecological examination (general) (routine) with abnormal findings: Secondary | ICD-10-CM | POA: Diagnosis not present

## 2022-11-14 ENCOUNTER — Other Ambulatory Visit: Payer: Self-pay | Admitting: Internal Medicine

## 2022-11-14 ENCOUNTER — Ambulatory Visit
Admission: RE | Admit: 2022-11-14 | Discharge: 2022-11-14 | Disposition: A | Discharge status: Home / Self Care | Payer: PPO | Source: Ambulatory Visit | Attending: Internal Medicine | Admitting: Internal Medicine

## 2022-11-14 DIAGNOSIS — Z1231 Encounter for screening mammogram for malignant neoplasm of breast: Secondary | ICD-10-CM

## 2022-12-15 ENCOUNTER — Other Ambulatory Visit: Payer: Self-pay | Admitting: Internal Medicine

## 2022-12-15 DIAGNOSIS — M810 Age-related osteoporosis without current pathological fracture: Secondary | ICD-10-CM

## 2023-06-23 ENCOUNTER — Ambulatory Visit
Admission: RE | Admit: 2023-06-23 | Discharge: 2023-06-23 | Disposition: A | Discharge status: Home / Self Care | Payer: PPO | Source: Ambulatory Visit | Attending: Internal Medicine | Admitting: Internal Medicine

## 2023-06-23 DIAGNOSIS — E2839 Other primary ovarian failure: Secondary | ICD-10-CM | POA: Diagnosis not present

## 2023-06-23 DIAGNOSIS — M8588 Other specified disorders of bone density and structure, other site: Secondary | ICD-10-CM | POA: Diagnosis not present

## 2023-06-23 DIAGNOSIS — N958 Other specified menopausal and perimenopausal disorders: Secondary | ICD-10-CM | POA: Diagnosis not present

## 2023-06-23 DIAGNOSIS — M810 Age-related osteoporosis without current pathological fracture: Secondary | ICD-10-CM

## 2023-06-24 DIAGNOSIS — M810 Age-related osteoporosis without current pathological fracture: Secondary | ICD-10-CM | POA: Diagnosis not present

## 2023-06-24 DIAGNOSIS — Z79899 Other long term (current) drug therapy: Secondary | ICD-10-CM | POA: Diagnosis not present

## 2023-07-02 DIAGNOSIS — C44529 Squamous cell carcinoma of skin of other part of trunk: Secondary | ICD-10-CM | POA: Diagnosis not present

## 2023-07-02 DIAGNOSIS — Z85828 Personal history of other malignant neoplasm of skin: Secondary | ICD-10-CM | POA: Diagnosis not present

## 2023-07-02 DIAGNOSIS — D0439 Carcinoma in situ of skin of other parts of face: Secondary | ICD-10-CM | POA: Diagnosis not present

## 2023-08-06 DIAGNOSIS — Z85828 Personal history of other malignant neoplasm of skin: Secondary | ICD-10-CM | POA: Diagnosis not present

## 2023-08-06 DIAGNOSIS — L821 Other seborrheic keratosis: Secondary | ICD-10-CM | POA: Diagnosis not present

## 2023-08-06 DIAGNOSIS — L570 Actinic keratosis: Secondary | ICD-10-CM | POA: Diagnosis not present

## 2023-08-06 DIAGNOSIS — D485 Neoplasm of uncertain behavior of skin: Secondary | ICD-10-CM | POA: Diagnosis not present

## 2023-08-06 DIAGNOSIS — D1801 Hemangioma of skin and subcutaneous tissue: Secondary | ICD-10-CM | POA: Diagnosis not present

## 2023-10-07 ENCOUNTER — Other Ambulatory Visit: Payer: Self-pay | Admitting: Internal Medicine

## 2023-10-07 DIAGNOSIS — Z1231 Encounter for screening mammogram for malignant neoplasm of breast: Secondary | ICD-10-CM

## 2023-10-15 DIAGNOSIS — M25569 Pain in unspecified knee: Secondary | ICD-10-CM | POA: Diagnosis not present

## 2023-11-06 DIAGNOSIS — D0439 Carcinoma in situ of skin of other parts of face: Secondary | ICD-10-CM | POA: Diagnosis not present

## 2023-11-10 DIAGNOSIS — M1711 Unilateral primary osteoarthritis, right knee: Secondary | ICD-10-CM | POA: Diagnosis not present

## 2023-11-16 ENCOUNTER — Ambulatory Visit
Admission: RE | Admit: 2023-11-16 | Discharge: 2023-11-16 | Disposition: A | Discharge status: Home / Self Care | Source: Ambulatory Visit | Attending: Internal Medicine | Admitting: Internal Medicine

## 2023-11-16 DIAGNOSIS — Z1231 Encounter for screening mammogram for malignant neoplasm of breast: Secondary | ICD-10-CM

## 2023-12-15 DIAGNOSIS — Z23 Encounter for immunization: Secondary | ICD-10-CM | POA: Diagnosis not present

## 2023-12-15 DIAGNOSIS — M810 Age-related osteoporosis without current pathological fracture: Secondary | ICD-10-CM | POA: Diagnosis not present

## 2023-12-15 DIAGNOSIS — E7800 Pure hypercholesterolemia, unspecified: Secondary | ICD-10-CM | POA: Diagnosis not present

## 2023-12-15 DIAGNOSIS — E890 Postprocedural hypothyroidism: Secondary | ICD-10-CM | POA: Diagnosis not present

## 2023-12-15 DIAGNOSIS — Z1331 Encounter for screening for depression: Secondary | ICD-10-CM | POA: Diagnosis not present

## 2023-12-15 DIAGNOSIS — Z0000 Encounter for general adult medical examination without abnormal findings: Secondary | ICD-10-CM | POA: Diagnosis not present

## 2023-12-15 DIAGNOSIS — Z79899 Other long term (current) drug therapy: Secondary | ICD-10-CM | POA: Diagnosis not present

## 2023-12-15 DIAGNOSIS — F325 Major depressive disorder, single episode, in full remission: Secondary | ICD-10-CM | POA: Diagnosis not present
